# Patient Record
Sex: Male | Born: 1995 | Race: White | Hispanic: No | Marital: Single | Smoking: Never smoker
Health system: Southern US, Community
[De-identification: ages and names within clinical notes are randomized; demographics above are authoritative.]

## PROBLEM LIST (undated history)

## (undated) DIAGNOSIS — F840 Autistic disorder: Secondary | ICD-10-CM

## (undated) DIAGNOSIS — Q860 Fetal alcohol syndrome (dysmorphic): Secondary | ICD-10-CM

## (undated) HISTORY — PX: HERNIA REPAIR: SHX51

---
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
## 2014-12-03 ENCOUNTER — Other Ambulatory Visit (HOSPITAL_COMMUNITY): Payer: Self-pay | Admitting: Family Medicine

## 2014-12-03 ENCOUNTER — Ambulatory Visit (HOSPITAL_COMMUNITY)
Admission: RE | Admit: 2014-12-03 | Discharge: 2014-12-03 | Disposition: A | Discharge status: Home / Self Care | Payer: Medicaid Other | Source: Ambulatory Visit | Attending: Family Medicine | Admitting: Family Medicine

## 2014-12-03 DIAGNOSIS — M545 Low back pain: Secondary | ICD-10-CM

## 2014-12-03 DIAGNOSIS — R200 Anesthesia of skin: Secondary | ICD-10-CM | POA: Diagnosis not present

## 2014-12-03 DIAGNOSIS — M4306 Spondylolysis, lumbar region: Secondary | ICD-10-CM | POA: Insufficient documentation

## 2017-01-27 ENCOUNTER — Emergency Department (HOSPITAL_COMMUNITY)
Admission: EM | Admit: 2017-01-27 | Discharge: 2017-01-27 | Disposition: A | Discharge status: Home / Self Care | Payer: Medicare Other | Attending: Emergency Medicine | Admitting: Emergency Medicine

## 2017-01-27 ENCOUNTER — Encounter (HOSPITAL_COMMUNITY): Payer: Self-pay | Admitting: *Deleted

## 2017-01-27 DIAGNOSIS — Z79899 Other long term (current) drug therapy: Secondary | ICD-10-CM | POA: Diagnosis not present

## 2017-01-27 DIAGNOSIS — E119 Type 2 diabetes mellitus without complications: Secondary | ICD-10-CM | POA: Diagnosis not present

## 2017-01-27 DIAGNOSIS — L03032 Cellulitis of left toe: Secondary | ICD-10-CM | POA: Diagnosis not present

## 2017-01-27 DIAGNOSIS — M79675 Pain in left toe(s): Secondary | ICD-10-CM | POA: Diagnosis present

## 2017-01-27 MED ORDER — LIDOCAINE HCL (PF) 2 % IJ SOLN
2.0000 mL | Freq: Once | INTRAMUSCULAR | Status: AC
  Administered 2017-01-27: 2 mL
  Filled 2017-01-27: qty 10

## 2017-01-27 MED ORDER — CEPHALEXIN 500 MG PO CAPS: 1000.0000 mg | ORAL_CAPSULE | Freq: Two times a day (BID) | ORAL | 0 refills | Status: AC

## 2017-01-27 MED ORDER — CEPHALEXIN 500 MG PO CAPS
1000.0000 mg | ORAL_CAPSULE | Freq: Once | ORAL | Status: AC
  Administered 2017-01-27: 1000 mg via ORAL
  Filled 2017-01-27: qty 2

## 2017-01-27 MED ORDER — POVIDONE-IODINE 10 % EX SOLN
CUTANEOUS | Status: AC
  Filled 2017-01-27: qty 118

## 2017-01-27 NOTE — ED Provider Notes (Addendum)
[facility]-EMER[facility]ENCY DEPT Provider Note   CSN: [id number] Arrival date & time: 01/27/17  1507     History   Chief Complaint Chief Complaint  Patient presents with  . Toe Pain    HPI Gregory Kelley is a 21 y.o. male with a history of autism, presenting with a 2 week history of left toe pain and swelling.  He reports a history of prior ingrown nail with cuticle infection several years ago that had to be "lanced".  He has used warm epsom salt and peroxide soaks with minimal improvement.  He denies fevers, chills, injury to the toe.   The history is provided by the patient and a parent.    Past Medical History:  Diagnosis Date  . Autism   . Diabetes mellitus   . Fetal alcohol syndrome     There are no active problems to display for this patient.   Past Surgical History:  Procedure Laterality Date  . HERNIA REPAIR         Home Medications    Prior to Admission medications   Medication Sig Start Date End Date Taking? Authorizing Provider  ARIPiprazole (ABILIFY) 5 MG tablet Take 5 mg by mouth 3 (three) times daily.      Historical Provider, MD  atomoxetine (STRATTERA) 100 MG capsule Take 50 mg by mouth 2 (two) times daily.      Historical Provider, MD  cephALEXin (KEFLEX) 500 MG capsule Take 2 capsules (1,000 mg total) by mouth 2 (two) times daily. 01/27/17 02/06/17  Burgess Amor, PA-C  methylphenidate (CONCERTA) 54 MG CR tablet Take 54 mg by mouth 2 (two) times daily.      Historical Provider, MD  methylphenidate (RITALIN LA) 20 MG 24 hr capsule Take 20 mg by mouth every morning.      Historical Provider, MD  UNABLE TO FIND Med Name:    Historical Provider, MD    Family History Family History  Problem Relation Age of Onset  . Adopted: Yes    Social History Social History  Substance Use Topics  . Smoking status: Never Smoker  . Smokeless tobacco: Never Used  . Alcohol use Yes     Comment: occasionally      Allergies   Patient has no known allergies.   Review  of Systems Review of Systems  Constitutional: Negative for fever.  Musculoskeletal: Positive for arthralgias. Negative for joint swelling and myalgias.  Skin: Positive for color change.  Neurological: Negative for weakness and numbness.     Physical Exam Updated Vital Signs BP 124/89 (BP Location: Right Arm)   Pulse 90   Temp 98.4 F (36.9 C) (Oral)   Resp 18   Ht  (1.702 m)   Wt 113.4 kg   SpO2 99%   BMI 39.16 kg/m   Physical Exam  Constitutional: He appears well-developed and well-nourished.  HENT:  Head: Atraumatic.  Neck: Normal range of motion.  Cardiovascular:  Pulses:      Dorsalis pedis pulses are 2+ on the left side.  Musculoskeletal: He exhibits edema and tenderness.  ttp with edema left medial great toenail edge.  Dried blood along nail plate edge. Moderate erythema and edema of the medial cuticle.  No red streaking.    Neurological: He is alert. He has normal strength. He displays normal reflexes. No sensory deficit.  Skin: Skin is warm and dry.  Psychiatric: He has a normal mood and affect.     ED Treatments / Results  Labs (all labs  ordered are listed, but only abnormal results are displayed) Labs Reviewed - No data to display  EKG  EKG Interpretation None       Radiology No results found.  Procedures Drain paronychia Date/Time: 01/27/2017 5:58 PM Performed by: Burgess Amor Authorized by: Burgess Amor  Consent: Verbal consent obtained. Consent given by: patient and parent Patient understanding: patient states understanding of the procedure being performed Patient consent: the patient's understanding of the procedure matches consent given Patient identity confirmed: verbally with patient Time out: Immediately prior to procedure a "time out" was called to verify the correct patient, procedure, equipment, support staff and site/side marked as required. Preparation: Patient was prepped and draped in the usual sterile fashion. Local  anesthesia used: yes Anesthesia: digital block  Anesthesia: Local anesthesia used: yes Local Anesthetic: lidocaine 1% without epinephrine Anesthetic total: 2 mL  Sedation: Patient sedated: no Patient tolerance: Patient tolerated the procedure well with no immediate complications Comments: #11 blade used to raise the cuticle edge from the nail plate.  No incision was made.  Small amount of purulence obtained.  Moderate bleeding.  No ingrown nail appreciated.    (including critical care time)  Medications Ordered in ED Medications  povidone-iodine (BETADINE) 10 % external solution (not administered)  cephALEXin (KEFLEX) capsule 1,000 mg (not administered)  lidocaine (XYLOCAINE) 2 % injection 2 mL (2 mLs Other Given by Other 01/27/17 1706)     Initial Impression / Assessment and Plan / ED Course  I have reviewed the triage vital signs and the nursing notes.  Pertinent labs & imaging results that were available during my care of the patient were reviewed by me and considered in my medical decision making (see chart for details).     Keflex, continue warm soaks.  Prn f/u if not improving over the next week.  Referral to podiatry prn.  Final Clinical Impressions(s) / ED Diagnoses   Final diagnoses:  Paronychia of great toe of left foot    New Prescriptions New Prescriptions   CEPHALEXIN (KEFLEX) 500 MG CAPSULE    Take 2 capsules (1,000 mg total) by mouth 2 (two) times daily.     Burgess Amor, PA-C 01/27/17 1801    Benjiman Core, MD 01/28/17 Burna Mortimer    Burgess Amor, PA-C 02/14/17 2058    Burgess Amor, PA-C 02/14/17 2102    Benjiman Core, MD 02/16/17 [phone]

## 2017-01-27 NOTE — ED Notes (Signed)
Pt made aware to return if symptoms worsen or if any life threatening symptoms occur.   

## 2017-01-27 NOTE — Discharge Instructions (Signed)
Take your next dose of the antibiotic tomorrow morning.  Continue doing a warm epsom salt soak for 15 minutes twice daily.  Keep your wound covered and clean.  Wear the post op shoe given if this improves your pain.

## 2017-01-27 NOTE — ED Triage Notes (Signed)
Pt c/o left great toe pain x couple weeks. Pt has soaked the toe in epsom salt and peroxide. Pt's left great toe has swelling next to nailbed. No drainage at this time. Denies fever.

## 2017-12-30 ENCOUNTER — Other Ambulatory Visit (HOSPITAL_COMMUNITY): Payer: Self-pay | Admitting: Neurology

## 2017-12-30 ENCOUNTER — Other Ambulatory Visit (HOSPITAL_BASED_OUTPATIENT_CLINIC_OR_DEPARTMENT_OTHER): Payer: Self-pay

## 2017-12-30 DIAGNOSIS — G4733 Obstructive sleep apnea (adult) (pediatric): Secondary | ICD-10-CM

## 2017-12-30 DIAGNOSIS — R519 Headache, unspecified: Secondary | ICD-10-CM

## 2017-12-30 DIAGNOSIS — R51 Headache: Principal | ICD-10-CM

## 2018-01-06 ENCOUNTER — Ambulatory Visit (HOSPITAL_COMMUNITY): Admission: RE | Admit: 2018-01-06 | Payer: Medicare Other | Source: Ambulatory Visit

## 2018-01-17 ENCOUNTER — Encounter (INDEPENDENT_AMBULATORY_CARE_PROVIDER_SITE_OTHER): Payer: Self-pay

## 2018-01-17 ENCOUNTER — Ambulatory Visit: Payer: BLUE CROSS/BLUE SHIELD | Attending: Neurology | Admitting: Neurology

## 2018-01-17 DIAGNOSIS — Z79899 Other long term (current) drug therapy: Secondary | ICD-10-CM | POA: Insufficient documentation

## 2018-01-17 DIAGNOSIS — R0683 Snoring: Secondary | ICD-10-CM | POA: Insufficient documentation

## 2018-01-17 DIAGNOSIS — G4733 Obstructive sleep apnea (adult) (pediatric): Secondary | ICD-10-CM | POA: Diagnosis present

## 2018-01-22 NOTE — Procedures (Signed)
   [facility] Camala Talwar A. Gerilyn Pilgrimoonquah, MD     [email or web address]             NOCTURNAL POLYSOMNOGRAPHY   LOCATION: [facility]  Patient Name: Lorrin MaisCrumpton, Safal Study Date: 01/17/2018 Gender: Male D.O.B: [birth year] Age (years): 21 Referring Provider: Jorge MandrilAyeshia Powell NP Height (inches): 70 Interpreting Physician: Beryle BeamsKofi Kelsay Haggard MD, ABSM Weight (lbs): 258 RPSGT: Alfonso EllisHedrick, Debra BMI: 37 MRN: [id number] Neck Size: 19.00 CLINICAL INFORMATION Sleep Study Type: NPSG     Indication for sleep study: N/A     Epworth Sleepiness Score: 11     SLEEP STUDY TECHNIQUE As per the AASM Manual for the Scoring of Sleep and Associated Events v2.3 (April 2016) with a hypopnea requiring 4% desaturations.  The channels recorded and monitored were frontal, central and occipital EEG, electrooculogram (EOG), submentalis EMG (chin), nasal and oral airflow, thoracic and abdominal wall motion, anterior tibialis EMG, snore microphone, electrocardiogram, and pulse oximetry.  MEDICATIONS Medications self-administered by patient taken the night of the study : N/A  Current Outpatient Medications:  .  ARIPiprazole (ABILIFY) 5 MG tablet, Take 5 mg by mouth 3 (three) times daily.  , Disp: , Rfl:  .  atomoxetine (STRATTERA) 100 MG capsule, Take 50 mg by mouth 2 (two) times daily.  , Disp: , Rfl:  .  methylphenidate (CONCERTA) 54 MG CR tablet, Take 54 mg by mouth 2 (two) times daily.  , Disp: , Rfl:  .  methylphenidate (RITALIN LA) 20 MG 24 hr capsule, Take 20 mg by mouth every morning.  , Disp: , Rfl:        SLEEP ARCHITECTURE The study was initiated at 10:25:12 PM and ended at 5:14:19 AM.   Sleep onset time was 8.8 minutes and the sleep efficiency was 91.3%%. The total sleep time was 373.4 minutes.  Stage REM latency was 134.5 minutes.  The patient spent 2.5%% of the night in stage N1 sleep, 69.7%% in stage N2 sleep, 14.1%% in stage N3 and 13.66% in REM.  Alpha intrusion was  absent.  Supine sleep was 90.29%.  RESPIRATORY PARAMETERS The overall apnea/hypopnea index (AHI) was 1.1 per hour. There were 1 total apneas, including 1 obstructive, 0 central and 0 mixed apneas. There were 6 hypopneas and 1 RERAs.  The AHI during Stage REM sleep was 8.2 per hour.  AHI while supine was 1.2 per hour.  The mean oxygen saturation was 95.9%. The minimum SpO2 during sleep was 91.0%.  moderate snoring was noted during this study.  CARDIAC DATA The 2 lead EKG demonstrated sinus rhythm. The mean heart rate was 80.2 beats per minute. Other EKG findings include: None. LEG MOVEMENT DATA The total PLMS were 0 with a resulting PLMS index of 0.0. Associated arousal with leg movement index was 3.7.  IMPRESSIONS 1. This recording is unremarkable.  Argie RammingKofi A Braydon Kullman, MD Diplomate, American Board of Sleep Medicine. ELECTRONICALLY SIGNED ON:  01/22/2018, 8:46 PM [facility] PH: [phone]   FX: [phone] ACCREDITED BY THE AMERICAN ACADEMY OF SLEEP MEDICINE

## 2018-01-23 ENCOUNTER — Ambulatory Visit (HOSPITAL_COMMUNITY)
Admission: RE | Admit: 2018-01-23 | Discharge: 2018-01-23 | Disposition: A | Discharge status: Home / Self Care | Payer: Medicare Other | Source: Ambulatory Visit | Attending: Neurology | Admitting: Neurology

## 2018-01-23 DIAGNOSIS — R51 Headache: Secondary | ICD-10-CM | POA: Insufficient documentation

## 2018-01-23 DIAGNOSIS — R519 Headache, unspecified: Secondary | ICD-10-CM

## 2018-09-01 ENCOUNTER — Ambulatory Visit (HOSPITAL_COMMUNITY)
Admission: RE | Admit: 2018-09-01 | Discharge: 2018-09-01 | Disposition: A | Discharge status: Home / Self Care | Payer: BLUE CROSS/BLUE SHIELD | Source: Ambulatory Visit | Attending: Family Medicine | Admitting: Family Medicine

## 2018-09-01 ENCOUNTER — Other Ambulatory Visit (HOSPITAL_COMMUNITY): Payer: Self-pay | Admitting: Family Medicine

## 2018-09-01 DIAGNOSIS — M542 Cervicalgia: Secondary | ICD-10-CM | POA: Diagnosis present

## 2018-09-01 DIAGNOSIS — G8929 Other chronic pain: Secondary | ICD-10-CM | POA: Diagnosis present

## 2019-08-04 IMAGING — DX DG CERVICAL SPINE COMPLETE 4+V
7 series · 7 of 7 positions shown · non-contrast
Comparison: None.

CLINICAL DATA: Chronic neck pain.

EXAM:
CERVICAL SPINE - COMPLETE 4+ VIEW

[c-spine lat]
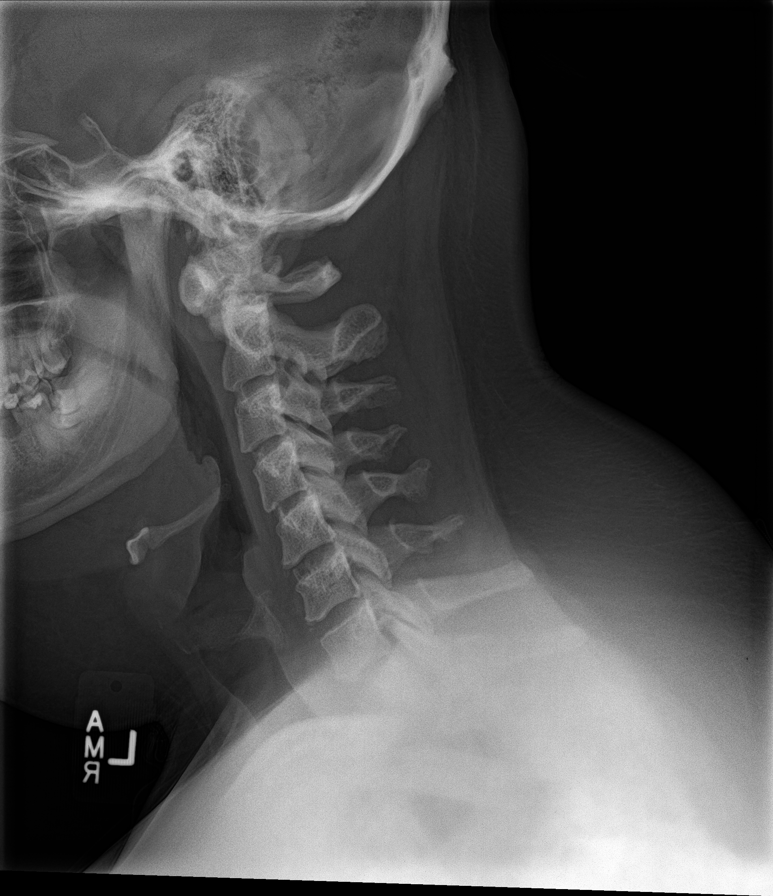

[c-spine obl (1 of 2)]
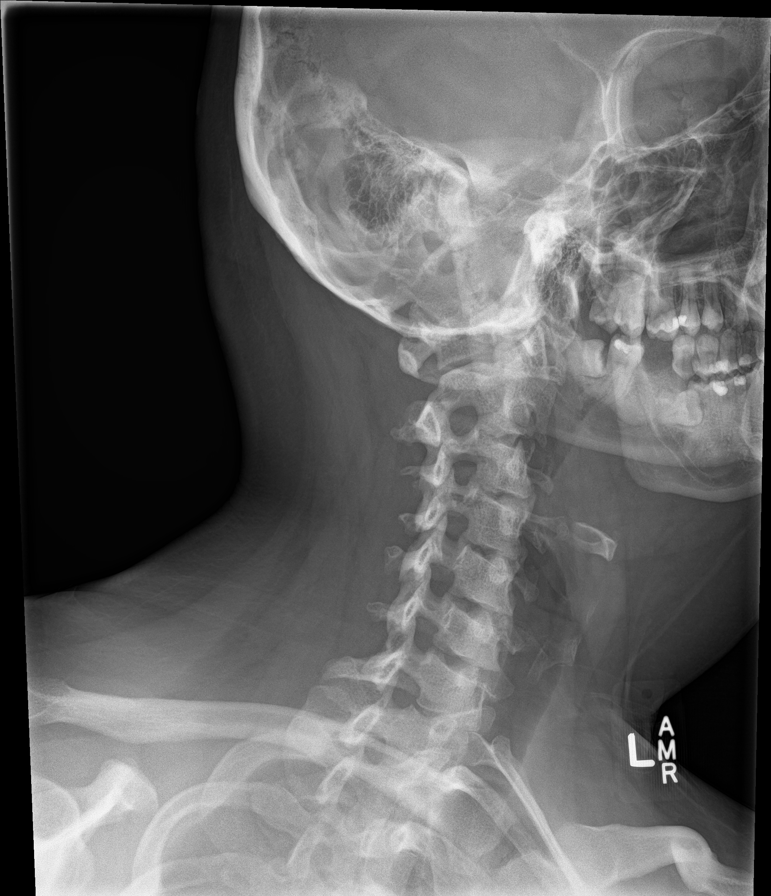

[c-spine obl (2 of 2)]
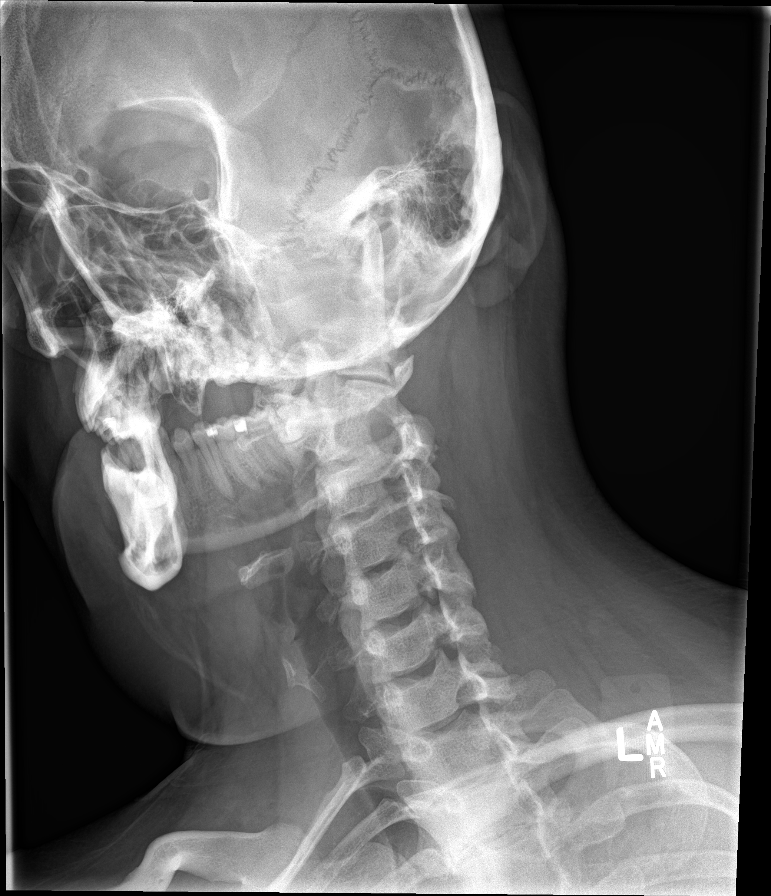

[c-spine ap]
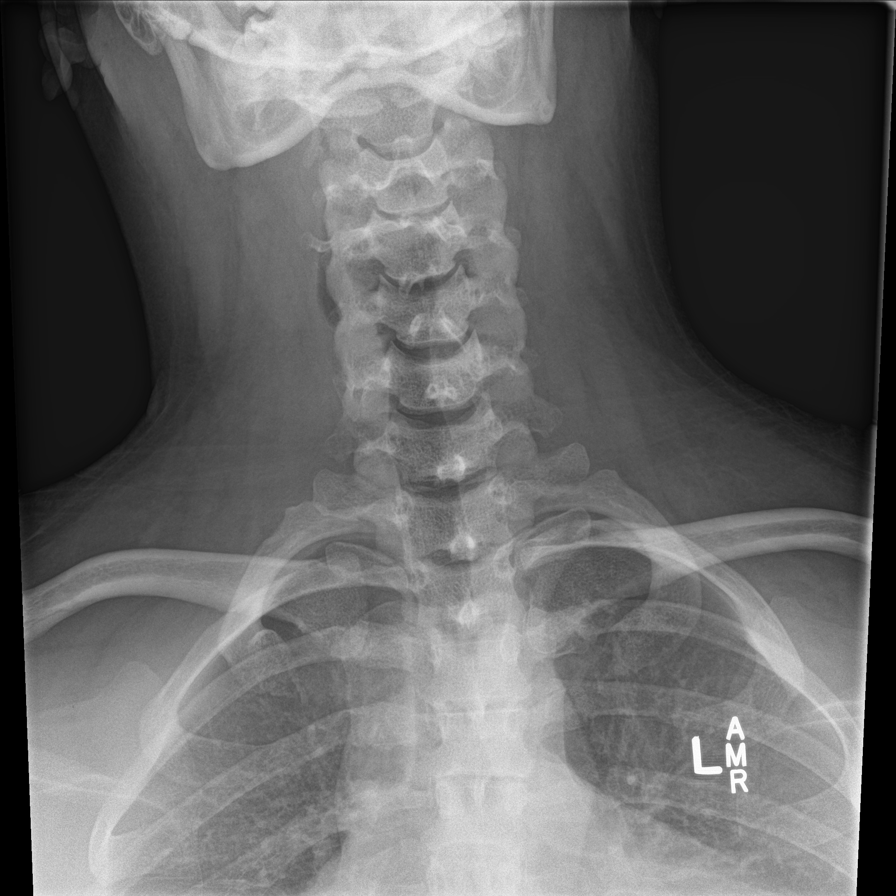

[c-spine open mouth (1 of 3)]
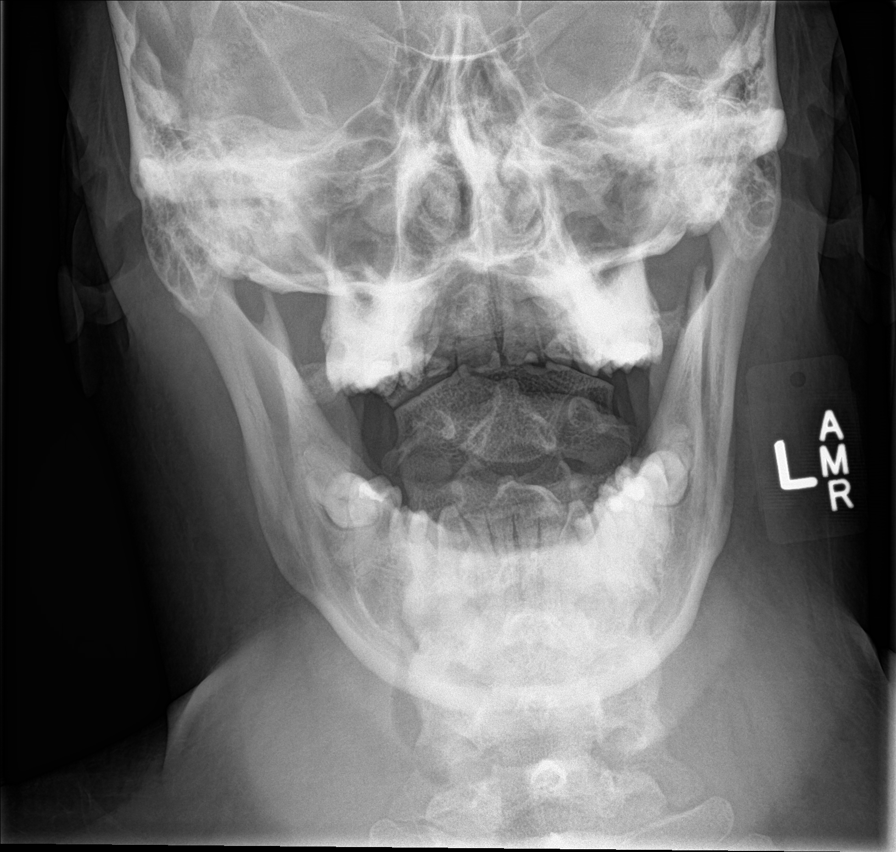

[c-spine open mouth (2 of 3)]
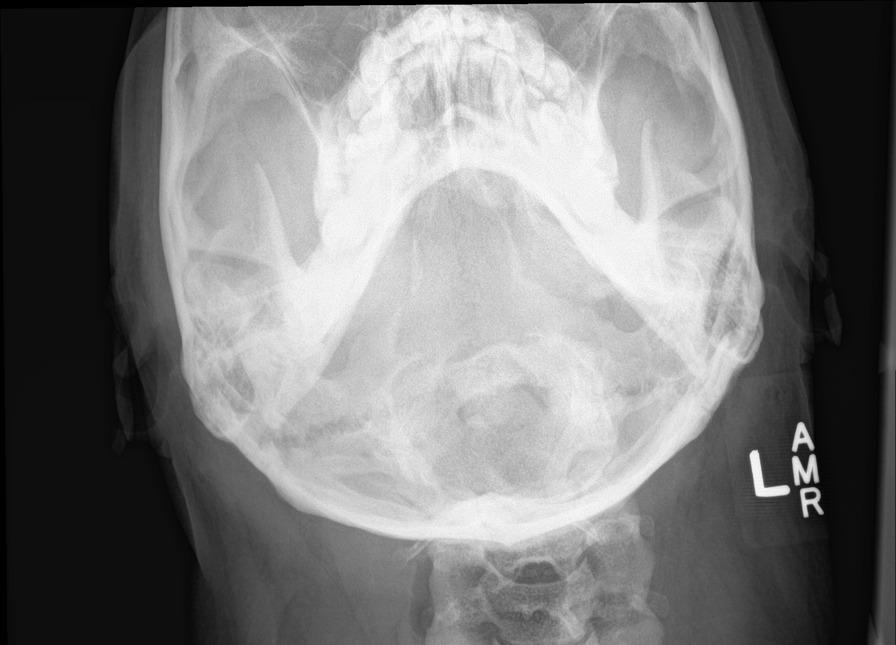

[c-spine open mouth (3 of 3)]
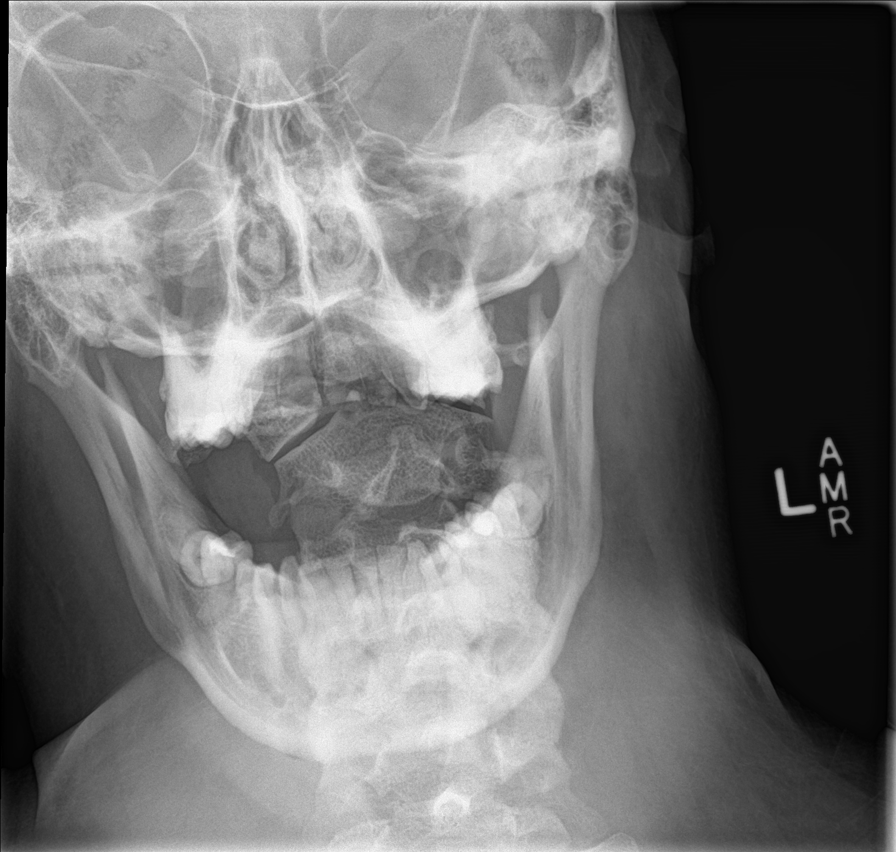

[7 of 7 positions shown; findings below may reference images not displayed]

FINDINGS: There is no evidence of acute cervical spine fracture or
prevertebral soft tissue swelling. Alignment is normal.
Intervertebral disc spaces are maintained. No evidence of facet DJD
or foraminal stenosis.

The odontoid process is diminutive in appearance, which may
secondary to os odontoideum or congenital hypoplasia.
IMPRESSION: No acute findings.

Abnormal appearance of odontoid, suspicious for os odontoideum or
congenital hypoplasia. Recommend cervical spine CT without contrast
for further evaluation.

## 2020-11-16 ENCOUNTER — Encounter (HOSPITAL_COMMUNITY): Payer: Self-pay

## 2020-11-16 ENCOUNTER — Emergency Department (HOSPITAL_COMMUNITY): Payer: Medicare Other

## 2020-11-16 ENCOUNTER — Other Ambulatory Visit: Payer: Self-pay

## 2020-11-16 ENCOUNTER — Emergency Department (HOSPITAL_COMMUNITY)
Admission: EM | Admit: 2020-11-16 | Discharge: 2020-11-16 | Disposition: A | Discharge status: Home / Self Care | Payer: Medicare Other | Attending: Emergency Medicine | Admitting: Emergency Medicine

## 2020-11-16 DIAGNOSIS — F840 Autistic disorder: Secondary | ICD-10-CM | POA: Insufficient documentation

## 2020-11-16 DIAGNOSIS — N23 Unspecified renal colic: Secondary | ICD-10-CM | POA: Diagnosis not present

## 2020-11-16 DIAGNOSIS — E119 Type 2 diabetes mellitus without complications: Secondary | ICD-10-CM | POA: Insufficient documentation

## 2020-11-16 DIAGNOSIS — R1031 Right lower quadrant pain: Secondary | ICD-10-CM | POA: Diagnosis present

## 2020-11-16 LAB — COMPREHENSIVE METABOLIC PANEL
ALT: 111 U/L — ABNORMAL HIGH (ref 0–44)
AST: 42 U/L — ABNORMAL HIGH (ref 15–41)
Albumin: 4.1 g/dL (ref 3.5–5.0)
Alkaline Phosphatase: 88 U/L (ref 38–126)
Anion gap: 8 (ref 5–15)
BUN: 11 mg/dL (ref 6–20)
CO2: 26 mmol/L (ref 22–32)
Calcium: 9.6 mg/dL (ref 8.9–10.3)
Chloride: 105 mmol/L (ref 98–111)
Creatinine, Ser: 0.71 mg/dL (ref 0.61–1.24)
GFR, Estimated: >60 mL/min (ref >60)
Glucose, Bld: 133 mg/dL — ABNORMAL HIGH (ref 70–99)
Potassium: 3.5 mmol/L (ref 3.5–5.1)
Sodium: 139 mmol/L (ref 135–145)
Total Bilirubin: 0.9 mg/dL (ref 0.3–1.2)
Total Protein: 7.5 g/dL (ref 6.5–8.1)

## 2020-11-16 LAB — CBC WITH DIFFERENTIAL/PLATELET
Abs Immature Granulocytes: 0.04 10*3/uL (ref 0.00–0.07)
Basophils Absolute: 0.1 10*3/uL (ref 0.0–0.1)
Basophils Relative: 1 %
Eosinophils Absolute: 0.2 10*3/uL (ref 0.0–0.5)
Eosinophils Relative: 3 %
HCT: 44 % (ref 39.0–52.0)
Hemoglobin: 14.1 g/dL (ref 13.0–17.0)
Immature Granulocytes: 0 %
Lymphocytes Relative: 35 %
Lymphs Abs: 3.2 10*3/uL (ref 0.7–4.0)
MCH: 25.9 pg — ABNORMAL LOW (ref 26.0–34.0)
MCHC: 32 g/dL (ref 30.0–36.0)
MCV: 80.7 fL (ref 80.0–100.0)
Monocytes Absolute: 0.8 10*3/uL (ref 0.1–1.0)
Monocytes Relative: 8 %
Neutro Abs: 5 10*3/uL (ref 1.7–7.7)
Neutrophils Relative %: 53 %
Platelets: 284 10*3/uL (ref 150–400)
RBC: 5.45 MIL/uL (ref 4.22–5.81)
RDW: 13.3 % (ref 11.5–15.5)
WBC: 9.2 10*3/uL (ref 4.0–10.5)
nRBC: 0 % (ref 0.0–0.2)

## 2020-11-16 LAB — LIPASE, BLOOD: Lipase: 73 U/L — ABNORMAL HIGH (ref 11–51)

## 2020-11-16 MED ORDER — MORPHINE SULFATE (PF) 4 MG/ML IV SOLN
4.0000 mg | Freq: Once | INTRAVENOUS | Status: AC
  Administered 2020-11-16: 4 mg via INTRAVENOUS
  Filled 2020-11-16: qty 1

## 2020-11-16 MED ORDER — ONDANSETRON HCL 4 MG/2ML IJ SOLN
4.0000 mg | Freq: Once | INTRAMUSCULAR | Status: AC
  Administered 2020-11-16: 4 mg via INTRAVENOUS
  Filled 2020-11-16: qty 2

## 2020-11-16 MED ORDER — ONDANSETRON HCL 4 MG PO TABS: 4.0000 mg | ORAL_TABLET | Freq: Four times a day (QID) | ORAL | 0 refills | Status: DC | PRN

## 2020-11-16 MED ORDER — SODIUM CHLORIDE 0.9 % IV BOLUS
1000.0000 mL | Freq: Once | INTRAVENOUS | Status: AC
  Administered 2020-11-16: 1000 mL via INTRAVENOUS

## 2020-11-16 MED ORDER — IOHEXOL 300 MG/ML  SOLN
100.0000 mL | Freq: Once | INTRAMUSCULAR | Status: AC | PRN
  Administered 2020-11-16: 100 mL via INTRAVENOUS

## 2020-11-16 MED ORDER — HYDROCODONE-ACETAMINOPHEN 5-325 MG PO TABS: 1.0000 | ORAL_TABLET | ORAL | 0 refills | Status: DC | PRN

## 2020-11-16 MED ORDER — TAMSULOSIN HCL 0.4 MG PO CAPS: 0.4000 mg | ORAL_CAPSULE | Freq: Every day | ORAL | 0 refills | Status: DC

## 2020-11-16 MED ORDER — KETOROLAC TROMETHAMINE 30 MG/ML IJ SOLN
30.0000 mg | Freq: Once | INTRAMUSCULAR | Status: AC
  Administered 2020-11-16: 30 mg via INTRAVENOUS
  Filled 2020-11-16: qty 1

## 2020-11-16 NOTE — ED Provider Notes (Signed)
[facility] EMERGENCY DEPARTMENT Provider Note   CSN: [id number] Arrival date & time: 11/16/20  7505     History Chief Complaint  Patient presents with  . Abdominal Pain    Gregory Kelley is a 25 y.o. male.  Patient brought to the emergency department for evaluation of abdominal pain.  Pain started earlier today.  Patient reports that he has been "going to the bathroom a lot".  He has not had nausea or vomiting.  Patient does have a history of autism has difficulty describing his symptoms.  Mother reports that he does not normally complain of pain.        Past Medical History:  Diagnosis Date  . Autism   . Diabetes mellitus   . Fetal alcohol syndrome     Patient Active Problem List   Diagnosis Date Noted  . 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 Developmental expressive language disorder 07/26/2011  . Diabetes mellitus, type II (HCC) 07/26/2011  . 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 Oppositional defiant disorder 07/26/2011  . Overweight 07/26/2011  . 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 HERNIA REPAIR         Family History  Adopted: Yes    Social History   Tobacco Use  . Smoking status: Never Smoker  . Smokeless tobacco: Never Used  Substance Use Topics  . Alcohol use: Yes    Comment: occasionally   . Drug use: No    Home Medications Prior to Admission medications   Medication Sig Start Date End Date Taking? Authorizing Provider  HYDROcodone-acetaminophen (NORCO/VICODIN) 5-325 MG tablet Take 1 tablet by mouth every 4 (four) hours as needed for moderate pain. 11/16/20  Yes Dyneshia Baccam, Canary Brim, MD  ondansetron (ZOFRAN) 4 MG tablet Take 1 tablet (4 mg total) by  mouth every 6 (six) hours as needed for nausea or vomiting. 11/16/20  Yes Krystall Kruckenberg, Canary Brim, MD  tamsulosin (FLOMAX) 0.4 MG CAPS capsule Take 1 capsule (0.4 mg total) by mouth daily. 11/16/20  Yes Lyan Holck, Canary Brim, MD  amphetamine-dextroamphetamine (ADDERALL) 30 MG tablet Take 1 tablet by mouth 2 (two) times daily. 10/14/20   [provider]  ARIPiprazole (ABILIFY) 5 MG tablet Take 5 mg by mouth 3 (three) times daily.      [provider]  atomoxetine (STRATTERA) 100 MG capsule Take 50 mg by mouth 2 (two) times daily.      [provider]  methylphenidate (CONCERTA) 54 MG CR tablet Take 54 mg by mouth 2 (two) times daily.      [provider]  methylphenidate (RITALIN LA) 20 MG 24 hr capsule Take 20 mg by mouth every morning.      [provider]       [provider]    Allergies    Patient has no known allergies.  Review of Systems   Review of Systems  Gastrointestinal: Positive for abdominal pain.  All other systems reviewed and are negative.   Physical Exam Updated Vital Signs BP 128/89   Pulse 80   Temp 98 F (36.7 C)   Resp 16   SpO2 98%   Physical Exam Vitals and nursing note reviewed.  Constitutional:      General: He is not in acute distress.  Appearance: Normal appearance. He is well-developed and well-nourished.  HENT:     Head: Normocephalic and atraumatic.     Right Ear: Hearing normal.     Left Ear: Hearing normal.     Nose: Nose normal.     Mouth/Throat:     Mouth: Oropharynx is clear and moist and mucous membranes are normal.  Eyes:     Extraocular Movements: EOM normal.     Conjunctiva/sclera: Conjunctivae normal.     Pupils: Pupils are equal, round, and reactive to light.  Cardiovascular:     Rate and Rhythm: Regular rhythm.     Heart sounds: S1 normal and S2 normal. No murmur heard. No friction rub. No gallop.   Pulmonary:     Effort: Pulmonary effort is normal. No respiratory distress.      Breath sounds: Normal breath sounds.  Chest:     Chest wall: No tenderness.  Abdominal:     General: Bowel sounds are normal.     Palpations: Abdomen is soft. There is no hepatosplenomegaly.     Tenderness: There is abdominal tenderness in the right lower quadrant. There is no guarding or rebound. Negative signs include Murphy's sign and McBurney's sign.     Hernia: No hernia is present.  Musculoskeletal:        General: Normal range of motion.     Cervical back: Normal range of motion and neck supple.  Skin:    General: Skin is warm, dry and intact.     Findings: No rash.     Nails: There is no cyanosis.  Neurological:     Mental Status: He is alert and oriented to person, place, and time.     GCS: GCS eye subscore is 4. GCS verbal subscore is 5. GCS motor subscore is 6.     Cranial Nerves: No cranial nerve deficit.     Sensory: No sensory deficit.     Coordination: Coordination normal.     Deep Tendon Reflexes: Strength normal.  Psychiatric:        Mood and Affect: Mood and affect normal.        Speech: Speech normal.        Behavior: Behavior normal.        Thought Content: Thought content normal.     ED Results / Procedures / Treatments   Labs (all labs ordered are listed, but only abnormal results are displayed) Labs Reviewed  CBC WITH DIFFERENTIAL/PLATELET - Abnormal; Notable for the following components:      Result Value   MCH 25.9 (*)    All other components within normal limits  COMPREHENSIVE METABOLIC PANEL - Abnormal; Notable for the following components:   Glucose, Bld 133 (*)    AST 42 (*)    ALT 111 (*)    All other components within normal limits  LIPASE, BLOOD - Abnormal; Notable for the following components:   Lipase 73 (*)    All other components within normal limits  URINALYSIS, ROUTINE W REFLEX MICROSCOPIC    EKG None  Radiology CT ABDOMEN PELVIS W CONTRAST  Result Date: 11/16/2020 CLINICAL DATA:  Right lower quadrant abdominal pain.  Suspect appendicitis. EXAM: CT ABDOMEN AND PELVIS WITH CONTRAST TECHNIQUE: Multidetector CT imaging of the abdomen and pelvis was performed using the standard protocol following bolus administration of intravenous contrast. CONTRAST:  OMNIPAQUE IOHEXOL 300 MG/ML  SOLN COMPARISON:  None. FINDINGS: Lower chest: The lung bases are clear without focal nodule, mass, or airspace disease. Heart  size is normal. No significant pleural or pericardial effusion is present. Hepatobiliary: No focal liver abnormality is seen. No gallstones, gallbladder wall thickening, or biliary dilatation. Pancreas: Unremarkable. No pancreatic ductal dilatation or surrounding inflammatory changes. Spleen: Normal in size without focal abnormality. Adrenals/Urinary Tract: Adrenal glands are normal bilaterally. Moderate right-sided hydronephrosis is present. Two distal obstructing ureteral stones are present just above the UVJ. Each stone measures 3 mm. No additional stones are evident in either kidney. No mass lesion is present. Left kidney and ureter are within normal limits. The urinary bladder is unremarkable. Stomach/Bowel: The stomach and duodenum are within normal limits. Small bowel is unremarkable. Terminal ileum is within normal limits. Appendix is visualized and normal. The ascending and transverse colon are within normal limits. The descending and sigmoid colon are normal. Vascular/Lymphatic: No significant vascular findings are present. No enlarged abdominal or pelvic lymph nodes. Reproductive: Prostate is unremarkable. Other: No abdominal wall hernia or abnormality. No abdominopelvic ascites. Musculoskeletal: Bilateral L5 pars defects are present. 4 mm anterolisthesis is present at L5-S1. No other significant listhesis is present. Schmorl's nodes are present. Vertebral body heights are otherwise within normal limits. Bony pelvis is normal. Hips are located and within normal limits. IMPRESSION: 1. Two distal obstructing ureteral  stones just above the UVJ measuring 3 mm each. 2. Moderate right-sided hydronephrosis and ureteral dilation. 3. No additional stones are present in either kidney. 4. 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 These results were called by telephone at the time of interpretation on 11/16/2020 at 5:22 am to provider [facility] , who verbally acknowledged these results. Electronically Signed   By: Marin Roberts M.D.   On: 11/16/2020 05:23    Procedures Procedures   Medications Ordered in ED Medications  ketorolac (TORADOL) 30 MG/ML injection 30 mg (has no administration in time range)  ondansetron (ZOFRAN) injection 4 mg (has no administration in time range)  sodium chloride 0.9 % bolus 1,000 mL (0 mLs Intravenous Stopped 11/16/20 0429)  morphine 4 MG/ML injection 4 mg (4 mg Intravenous Given 11/16/20 0319)  ondansetron (ZOFRAN) injection 4 mg (4 mg Intravenous Given 11/16/20 0320)  iohexol (OMNIPAQUE) 300 MG/ML solution 100 mL (100 mLs Intravenous Contrast Given 11/16/20 0442)    ED Course  I have reviewed the triage vital signs and the nursing notes.  Pertinent labs & imaging results that were available during my care of the patient were reviewed by me and considered in my medical decision making (see chart for details).    MDM Rules/Calculators/A&P                          Patient presents to the emergency department for evaluation of right-sided abdominal pain.  He does have mild right-sided tenderness but no guarding, rebound or signs of peritonitis.  Blood work was unremarkable.  CT abdomen and pelvis performed to rule out appendicitis.  No evidence of appendicitis noted but he does have right-sided hydronephrosis with 2 3 mm distal ureteral stones which explains his symptoms.  Patient treated with analgesia, will be discharged with analgesia, Flomax, follow-up with urology.  Final Clinical Impression(s) / ED Diagnoses Final diagnoses:  Renal colic on right  side    Rx / DC Orders ED Discharge Orders         Ordered    HYDROcodone-acetaminophen (NORCO/VICODIN) 5-325 MG tablet  Every 4 hours PRN        11/16/20 0548    tamsulosin (  FLOMAX) 0.4 MG CAPS capsule  Daily        11/16/20 0548    ondansetron (ZOFRAN) 4 MG tablet  Every 6 hours PRN        11/16/20 0548           Gilda Crease, MD 11/16/20 [phone]

## 2020-11-16 NOTE — ED Triage Notes (Signed)
Pt c/o RLQ pain that started today. Per mother pt normally doesn't complain of pain. Pt reports normal BM yesterday.

## 2021-01-29 ENCOUNTER — Encounter (HOSPITAL_COMMUNITY): Payer: Self-pay | Admitting: Emergency Medicine

## 2021-01-29 ENCOUNTER — Emergency Department (HOSPITAL_COMMUNITY): Payer: Medicare Other

## 2021-01-29 ENCOUNTER — Emergency Department (HOSPITAL_COMMUNITY)
Admission: EM | Admit: 2021-01-29 | Discharge: 2021-01-29 | Disposition: A | Discharge status: Home / Self Care | Payer: Medicare Other | Attending: Emergency Medicine | Admitting: Emergency Medicine

## 2021-01-29 ENCOUNTER — Other Ambulatory Visit: Payer: Self-pay

## 2021-01-29 DIAGNOSIS — L03011 Cellulitis of right finger: Secondary | ICD-10-CM | POA: Diagnosis present

## 2021-01-29 DIAGNOSIS — E119 Type 2 diabetes mellitus without complications: Secondary | ICD-10-CM | POA: Diagnosis not present

## 2021-01-29 DIAGNOSIS — F840 Autistic disorder: Secondary | ICD-10-CM | POA: Diagnosis not present

## 2021-01-29 MED ORDER — DOXYCYCLINE HYCLATE 100 MG PO TABS
100.0000 mg | ORAL_TABLET | Freq: Once | ORAL | Status: AC
  Administered 2021-01-29: 100 mg via ORAL
  Filled 2021-01-29: qty 1

## 2021-01-29 MED ORDER — DOXYCYCLINE HYCLATE 100 MG PO CAPS: 100.0000 mg | ORAL_CAPSULE | Freq: Two times a day (BID) | ORAL | 0 refills | Status: DC

## 2021-01-29 MED ORDER — LIDOCAINE HCL (PF) 1 % IJ SOLN
10.0000 mL | Freq: Once | INTRAMUSCULAR | Status: AC
  Administered 2021-01-29: 10 mL
  Filled 2021-01-29: qty 30

## 2021-01-29 NOTE — Discharge Instructions (Signed)
Paronychia that was drained today.  Keep area covered, clean and dry, soak the hand in warm water at least 3 times a day to help promote drainage and healing.  If you notice increased swelling, redness or drainage over the next few days please return for reevaluation.  Take antibiotics twice daily with food to help treat infection.  Ibuprofen and Tylenol as needed for pain.  Follow-up with your primary care provider.

## 2021-01-29 NOTE — ED Provider Notes (Signed)
[facility] EMERGENCY DEPARTMENT Provider Note   CSN: [id number] Arrival date & time: 01/29/21  2103     History Chief Complaint  Patient presents with  . Hand Pain    Gregory Kelley is a 25 y.o. male.  Gregory Kelley is a 25 y.o. male with a history of fetal alcohol syndrome, autism, diabetes, who presents to the ED for infection to the right index finger.  Pain redness and swelling worsening since Monday.  Mom reports she thinks he pulled a hangnail, but he did not show it to her until today because it was increasingly painful and swollen.  They tried soaking it in some alcohol and hydrogen peroxide but this did not help and it has not drained at all at home.  Had this happen once before a lot of toe and had to have it drained.  No fevers but patient has had some chills.  No other systemic symptoms.  Able to move the finger without difficulty.  No other aggravating or alleviating factors.        Past Medical History:  Diagnosis Date  . Autism   . Diabetes mellitus   . Fetal alcohol syndrome     Patient Active Problem List   Diagnosis Date Noted  . 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 Developmental expressive language disorder 07/26/2011  . Diabetes mellitus, type II (HCC) 07/26/2011  . 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 Oppositional defiant disorder 07/26/2011  . Overweight 07/26/2011  . 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 HERNIA REPAIR         Family History  Adopted: Yes    Social History   Tobacco Use  . Smoking status: Never Smoker  . Smokeless tobacco: Never Used  Substance Use Topics  . Alcohol use: Yes    Comment: occasionally   . Drug use: No     Home Medications Prior to Admission medications   Medication Sig Start Date End Date Taking? Authorizing Provider  amphetamine-dextroamphetamine (ADDERALL) 30 MG tablet Take 1 tablet by mouth 2 (two) times daily. 10/14/20  Yes [provider]  doxycycline (VIBRAMYCIN) 100 MG capsule Take 1 capsule (100 mg total) by mouth 2 (two) times daily. 01/29/21  Yes Dartha Lodge, PA-C  ibuprofen (ADVIL) 800 MG tablet Take 800 mg by mouth every 6 (six) hours as needed for moderate pain or headache. 12/12/20  Yes [provider]  Multiple Vitamin (MULTI-VITAMIN) tablet Take 1 tablet by mouth daily.   Yes [provider]  ondansetron (ZOFRAN) 4 MG tablet Take 1 tablet (4 mg total) by mouth every 6 (six) hours as needed for nausea or vomiting. 11/16/20  Yes Pollina, Canary Brim, MD  VYVANSE 70 MG capsule Take 70 mg by mouth every morning. 01/29/21  Yes [provider]  ARIPiprazole (ABILIFY) 5 MG tablet Take 5 mg by mouth 3 (three) times daily.   Patient not taking: No sig reported    [provider]  atomoxetine (STRATTERA) 100 MG capsule Take 50 mg by mouth 2 (two) times daily.   Patient not taking: No sig reported    [provider]  HYDROcodone-acetaminophen (NORCO/VICODIN) 5-325 MG tablet Take 1 tablet by mouth every 4 (four) hours as needed for moderate pain. Patient not taking:  No sig reported 11/16/20   Gilda Crease, MD  methylphenidate (CONCERTA) 54 MG CR tablet Take 54 mg by mouth 2 (two) times daily.   Patient not taking: No sig reported    [provider]  methylphenidate (RITALIN LA) 20 MG 24 hr capsule Take 20 mg by mouth every morning.   Patient not taking: No sig reported    [provider]  tamsulosin (FLOMAX) 0.4 MG CAPS capsule Take 1 capsule (0.4 mg total) by mouth daily. Patient not taking: No sig reported 11/16/20   Pollina, Canary Brim, MD  UNABLE TO FIND Med Name:  Patient not taking: No sig reported     [provider]    Allergies    Patient has no known allergies.  Review of Systems   Review of Systems  Constitutional: Negative for chills and fever.  Skin: Positive for color change.    Physical Exam Updated Vital Signs BP 134/90   Pulse (!) 105   Temp 98.2 F (36.8 C)   Resp 19   Ht 5\' 7"  (1.702 m)   Wt 127.7 kg   SpO2 98%   BMI 44.09 kg/m   Physical Exam Vitals and nursing note reviewed.  Constitutional:      General: He is not in acute distress.    Appearance: Normal appearance. He is well-developed. He is not ill-appearing or diaphoretic.  HENT:     Head: Normocephalic and atraumatic.  Eyes:     General:        Right eye: No discharge.        Left eye: No discharge.  Pulmonary:     Effort: Pulmonary effort is normal. No respiratory distress.  Musculoskeletal:     Comments: Right index finger with paronychia over the radial side of the nail bed, some surrounding erythema down to DIP jointdigit pad soft, no felon. Full ROM, normal sensation and cap refill.  Skin:    General: Skin is warm and dry.  Neurological:     Mental Status: He is alert and oriented to person, place, and time.     Coordination: Coordination normal.  Psychiatric:        Behavior: Behavior normal.     ED Results / Procedures / Treatments   Labs (all labs ordered are listed, but only abnormal results are displayed) Labs Reviewed - No data to display  EKG None  Radiology DG Finger Index Right  Result Date: 01/29/2021 CLINICAL DATA:  Right index finger pain, redness EXAM: RIGHT INDEX FINGER 2+V COMPARISON:  None. FINDINGS: There is no evidence of fracture or dislocation. There is no evidence of arthropathy or other focal bone abnormality. Soft tissues are unremarkable. IMPRESSION: Negative. Electronically Signed   By: 01/31/2021 M.D.   On: 01/29/2021 21:37    Procedures .04/23/2022Incision and Drainage  Date/Time: 01/29/2021 11:53 PM Performed by: 01/31/2021,  PA-C Authorized by: Dartha Lodge, PA-C   Consent:    Consent obtained:  Verbal   Consent given by:  Patient and parent   Risks, benefits, and alternatives were discussed: yes     Risks discussed:  Bleeding, incomplete drainage, pain, infection and damage to other organs   Alternatives discussed:  No treatment Universal protocol:    Procedure explained and questions answered to patient or proxy's satisfaction: yes     Patient identity confirmed:  Verbally with patient Location:    Type:  Abscess (Paronychiia)   Size:  2 cm   Location:  Upper  extremity   Upper extremity location:  Finger   Finger location:  R index finger Pre-procedure details:    Skin preparation:  Chlorhexidine with alcohol Sedation:    Sedation type:  None Anesthesia:    Anesthesia method:  Local infiltration   Local anesthetic:  Lidocaine 1% w/o epi Procedure type:    Complexity:  Simple Procedure details:    Ultrasound guidance: no     Needle aspiration: no     Incision types:  Single straight   Incision depth:  Dermal   Wound management:  Probed and deloculated   Drainage:  Purulent   Drainage amount:  Copious   Wound treatment:  Wound left open   Packing materials:  None Post-procedure details:    Procedure completion:  Tolerated well, no immediate complications     Medications Ordered in ED Medications  lidocaine (PF) (XYLOCAINE) 1 % injection 10 mL (10 mLs Infiltration Given 01/29/21 2154)  doxycycline (VIBRA-TABS) tablet 100 mg (100 mg Oral Given 01/29/21 2229)    ED Course  I have reviewed the triage vital signs and the nursing notes.  Pertinent labs & imaging results that were available during my care of the patient were reviewed by me and considered in my medical decision making (see chart for details).    MDM Rules/Calculators/A&P                          Exam consistent with paronychia to the right index finger.  No concern for felon.  Area amenable to incision and drainage, the  area was cleaned, anesthetized with digital block and opened with a small incision along the nailbed.  Copious amounts of purulent drainage expressed.  The area was cleaned, dressing was placed and patient started on doxycycline.  Wound care instructions provided and patient encouraged to do warm soaks at least 3 times daily.  Strict return precautions provided and PCP follow-up encouraged.  Patient and mother expressed understanding and agreement.  Discharged home in good condition.  Final Clinical Impression(s) / ED Diagnoses Final diagnoses:  Paronychia of finger, right    Rx / DC Orders ED Discharge Orders         Ordered    doxycycline (VIBRAMYCIN) 100 MG capsule  2 times daily        01/29/21 2221           Dartha Lodge, PA-C 01/30/21 0000    Pricilla Loveless, MD 01/31/21 [phone]

## 2021-01-29 NOTE — ED Triage Notes (Signed)
Pt c/o right index finger pain and redness x 1 week.

## 2021-03-04 ENCOUNTER — Other Ambulatory Visit: Payer: Self-pay

## 2021-03-04 ENCOUNTER — Emergency Department (HOSPITAL_COMMUNITY)
Admission: EM | Admit: 2021-03-04 | Discharge: 2021-03-05 | Disposition: A | Discharge status: Home / Self Care | Payer: Medicare Other | Attending: Emergency Medicine | Admitting: Emergency Medicine

## 2021-03-04 ENCOUNTER — Encounter (HOSPITAL_COMMUNITY): Payer: Self-pay

## 2021-03-04 DIAGNOSIS — F82 Specific developmental disorder of motor function: Secondary | ICD-10-CM | POA: Diagnosis not present

## 2021-03-04 DIAGNOSIS — R2231 Localized swelling, mass and lump, right upper limb: Secondary | ICD-10-CM | POA: Diagnosis present

## 2021-03-04 DIAGNOSIS — R030 Elevated blood-pressure reading, without diagnosis of hypertension: Secondary | ICD-10-CM | POA: Insufficient documentation

## 2021-03-04 DIAGNOSIS — F840 Autistic disorder: Secondary | ICD-10-CM | POA: Insufficient documentation

## 2021-03-04 DIAGNOSIS — E119 Type 2 diabetes mellitus without complications: Secondary | ICD-10-CM | POA: Diagnosis not present

## 2021-03-04 DIAGNOSIS — L03011 Cellulitis of right finger: Secondary | ICD-10-CM | POA: Insufficient documentation

## 2021-03-04 NOTE — ED Triage Notes (Signed)
Pt presents with mom- pt has autism- pt has swelling to left middle finger from "biting nails" Redness swelling present.

## 2021-03-05 MED ORDER — LIDOCAINE HCL (PF) 2 % IJ SOLN
10.0000 mL | Freq: Once | INTRAMUSCULAR | Status: AC
  Administered 2021-03-05: 10 mL

## 2021-03-05 NOTE — ED Provider Notes (Signed)
[facility] EMERGENCY DEPARTMENT Provider Note   CSN: [id number] Arrival date & time: 03/04/21  2238     History Chief Complaint  Patient presents with  . Finger Injury    Middle finger-cuticle inflamed from biting nails    Gregory Kelley is a 25 y.o. male.  The history is provided by the patient and a parent. The history is limited by the condition of the patient (Autism).  He has history of diabetes, autism and comes in with painful swelling of his left third finger.  Mother noticed it today.  There has been no known fever.  Mother states that he tends to chew his nails.   Past Medical History:  Diagnosis Date  . Autism   . Diabetes mellitus   . Fetal alcohol syndrome     Patient Active Problem List   Diagnosis Date Noted  . 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 Developmental expressive language disorder 07/26/2011  . Diabetes mellitus, type II (HCC) 07/26/2011  . 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 Oppositional defiant disorder 07/26/2011  . Overweight 07/26/2011  . 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 HERNIA REPAIR         Family History  Adopted: Yes    Social History   Tobacco Use  . Smoking status: Never Smoker  . Smokeless tobacco: Never Used  Substance Use Topics  . Alcohol use: Yes    Comment: occasionally   . Drug use: No    Home Medications Prior to Admission medications   Medication Sig Start Date End Date Taking? Authorizing Provider  amphetamine-dextroamphetamine (ADDERALL) 30 MG tablet Take 1 tablet by mouth 2 (two) times daily. 10/14/20   [provider]  ARIPiprazole (ABILIFY) 5 MG tablet Take 5 mg by mouth 3 (three) times daily.    Patient not taking: No sig reported    [provider]  atomoxetine (STRATTERA) 100 MG capsule Take 50 mg by mouth 2 (two) times daily.   Patient not taking: No sig reported    [provider]  doxycycline (VIBRAMYCIN) 100 MG capsule Take 1 capsule (100 mg total) by mouth 2 (two) times daily. 01/29/21   Dartha Lodge, PA-C  HYDROcodone-acetaminophen (NORCO/VICODIN) 5-325 MG tablet Take 1 tablet by mouth every 4 (four) hours as needed for moderate pain. Patient not taking: No sig reported 11/16/20   Gilda Crease, MD  ibuprofen (ADVIL) 800 MG tablet Take 800 mg by mouth every 6 (six) hours as needed for moderate pain or headache. 12/12/20   [provider]  methylphenidate (CONCERTA) 54 MG CR tablet Take 54 mg by mouth 2 (two) times daily.   Patient not taking: No sig reported    [provider]  methylphenidate (RITALIN LA) 20 MG 24 hr capsule Take 20 mg by mouth every morning.   Patient not taking: No sig reported    [provider]  Multiple Vitamin (MULTI-VITAMIN) tablet Take 1 tablet by mouth daily.    [provider]  ondansetron (ZOFRAN) 4 MG tablet Take 1 tablet (4 mg total) by mouth every 6 (six) hours as needed for nausea or vomiting. 11/16/20   Gilda Crease, MD  tamsulosin (FLOMAX) 0.4 MG CAPS capsule Take 1 capsule (0.4 mg total) by mouth daily.  Patient not taking: No sig reported 11/16/20   Pollina, Canary Brim, MD  UNABLE TO FIND Med Name: **  Patient not taking: No sig reported    [provider]  VYVANSE 70 MG capsule Take 70 mg by mouth every morning. 01/29/21   [provider]    Allergies    Patient has no known allergies.  Review of Systems   Review of Systems  Unable to perform ROS: Psychiatric disorder    Physical Exam Updated Vital Signs BP (!) 158/96 (BP Location: Right Arm)   Pulse 100   Temp 98 F (36.7 C) (Oral)   Resp 14   Ht 5\' 7"  (1.702 m)   Wt 127.5 kg   SpO2 100%    BMI 44.01 kg/m   Physical Exam Vitals and nursing note reviewed.   25 year old male, resting comfortably and in no acute distress. Vital signs are significant for elevated blood pressure. Oxygen saturation is 100%, which is normal. Head is normocephalic and atraumatic. PERRLA, EOMI.  Neck is nontender and supple without adenopathy or JVD. Back is nontender and there is no CVA tenderness. Lungs are clear without rales, wheezes, or rhonchi. Chest is nontender. Heart has regular rate and rhythm without murmur. Abdomen is soft, flat, nontender. Extremities: Paronychia present left third finger. Skin is warm and dry without rash. Neurologic: Awake and alert, answers questions appropriately.  Moves all extremities equally.  ED Results / Procedures / Treatments    Procedures .34Incision and Drainage  Date/Time: 03/05/2021 2:48 AM Performed by: 03/07/2021, MD Authorized by: Dione Booze, MD   Consent:    Consent obtained:  Verbal   Consent given by:  Parent   Risks, benefits, and alternatives were discussed: yes     Risks discussed:  Bleeding, incomplete drainage and pain   Alternatives discussed:  Alternative treatment Universal protocol:    Procedure explained and questions answered to patient or proxy's satisfaction: yes     Relevant documents present and verified: yes     Required blood products, implants, devices, and special equipment available: yes     Site/side marked: yes     Immediately prior to procedure, a time out was called: yes     Patient identity confirmed:  Verbally with patient and arm band Location:    Type:  Abscess   Size:  1 cm   Location:  Upper extremity   Upper extremity location:  Finger   Finger location:  R long finger Sedation:    Sedation type:  None Anesthesia:    Anesthesia method:  Nerve block   Block location:  Right third finger   Block needle gauge:  25 G   Block anesthetic:  Lidocaine 2% w/o epi   Block technique:  Digital block    Block injection procedure:  Anatomic landmarks identified, introduced needle, incremental injection, negative aspiration for blood and anatomic landmarks palpated   Block outcome:  Anesthesia achieved Procedure type:    Complexity:  Complex Procedure details:    Ultrasound guidance: no     Needle aspiration: no     Incision types:  Single straight   Incision depth:  Subcutaneous   Wound management:  Probed and deloculated   Drainage:  Purulent   Drainage amount:  Moderate   Wound treatment:  Wound left open   Packing materials:  None Post-procedure details:    Procedure completion:  Tolerated well, no immediate complications     Medications Ordered in ED Medications - No  data to display  ED Course  I have reviewed the triage vital signs and the nursing notes.  MDM Rules/Calculators/A&P                         Paronychia of the left third finger treated with incision and drainage old records are reviewed, and he has a prior ED visit for paronychia of a finger as well as visit for paronychia of a toe.  Final Clinical Impression(s) / ED Diagnoses Final diagnoses:  Paronychia of right middle finger  Elevated blood-pressure reading without diagnosis of hypertension    Rx / DC Orders ED Discharge Orders    None       Dione Booze, MD 03/05/21 [id number]

## 2021-05-15 ENCOUNTER — Encounter (HOSPITAL_COMMUNITY): Payer: Self-pay

## 2021-05-15 ENCOUNTER — Emergency Department (HOSPITAL_COMMUNITY)
Admission: EM | Admit: 2021-05-15 | Discharge: 2021-05-15 | Disposition: A | Discharge status: Home / Self Care | Payer: Medicare Other | Attending: Emergency Medicine | Admitting: Emergency Medicine

## 2021-05-15 ENCOUNTER — Other Ambulatory Visit: Payer: Self-pay

## 2021-05-15 DIAGNOSIS — F840 Autistic disorder: Secondary | ICD-10-CM | POA: Insufficient documentation

## 2021-05-15 DIAGNOSIS — N4889 Other specified disorders of penis: Secondary | ICD-10-CM | POA: Diagnosis present

## 2021-05-15 DIAGNOSIS — E119 Type 2 diabetes mellitus without complications: Secondary | ICD-10-CM | POA: Diagnosis not present

## 2021-05-15 DIAGNOSIS — N481 Balanitis: Secondary | ICD-10-CM | POA: Insufficient documentation

## 2021-05-15 MED ORDER — CLOTRIMAZOLE 1 % EX CREA: TOPICAL_CREAM | CUTANEOUS | 1 refills | Status: DC

## 2021-05-15 MED ORDER — LIDOCAINE HCL URETHRAL/MUCOSAL 2 % EX GEL
1.0000 "application " | Freq: Once | CUTANEOUS | Status: AC
  Administered 2021-05-15: 1 via TOPICAL
  Filled 2021-05-15: qty 10

## 2021-05-15 NOTE — ED Triage Notes (Signed)
Pt here with mother. Mother states he came in her room complaining of his penis hurting that he cant sleep. He has been bothering him a couple weeks. Pt is autistic .

## 2021-05-15 NOTE — ED Provider Notes (Signed)
[facility]-EMERGENCY DEPT [facility] Emergency Department Provider Note MRN:  [id number]  Arrival date & time: 05/15/21     Chief Complaint   Penis pain History of Present Illness   Gregory Kelley is a 25 y.o. year-old male with a history of autism, diabetes presenting to the ED with chief complaint of penis pain.  Pain to the tip of the penis for the past few hours, unable to sleep.  No fever, no other complaints.  Pain is currently mild.  Review of Systems  A problem-focused ROS was performed. Positive for penile pain.  Patient denies fever.  Patient's Health History    Past Medical History:  Diagnosis Date   Autism    Diabetes mellitus    Fetal alcohol syndrome     Past Surgical History:  Procedure Laterality Date   HERNIA REPAIR      Family History  Adopted: Yes    Social History   Socioeconomic History   Marital status: Single    Spouse name: Not on file   Number of children: Not on file   Years of education: Not on file   Highest education level: Not on file  Occupational History   Not on file  Tobacco Use   Smoking status: Never   Smokeless tobacco: Never  Substance and Sexual Activity   Alcohol use: Yes    Comment: occasionally    Drug use: No   Sexual activity: Not on file  Other Topics Concern   Not on file  Social History Narrative   Not on file   Social Determinants of Health   Financial Resource Strain: Not on file  Food Insecurity: Not on file  Transportation Needs: Not on file  Physical Activity: Not on file  Stress: Not on file  Social Connections: Not on file  Intimate Partner Violence: Not on file     Physical Exam   Vitals:   05/15/21 0305  BP: 128/75  Pulse: 95  Resp: 16  Temp: 98.5 F (36.9 C)  SpO2: 97%    CONSTITUTIONAL: Well-appearing, NAD NEURO:  Alert and oriented x 3, no focal deficits EYES:  eyes equal and reactive ENT/NECK:  no LAD, no JVD CARDIO: Regular rate, well-perfused, normal S1 and S2 PULM:   CTAB no wheezing or rhonchi GI/GU:  normal bowel sounds, non-distended, non-tender; erythema to the glans, foreskin easily retracts MSK/SPINE:  No gross deformities, no edema SKIN:  no rash, atraumatic PSYCH:  Appropriate speech and behavior  *Additional and/or pertinent findings included in MDM below  Diagnostic and Interventional Summary    EKG Interpretation  Date/Time:    Ventricular Rate:    PR Interval:    QRS Duration:   QT Interval:    QTC Calculation:   R Axis:     Text Interpretation:         Labs Reviewed - No data to display  No orders to display    Medications  lidocaine (XYLOCAINE) 2 % jelly 1 application (has no administration in time range)     Procedures  /  Critical Care Procedures  ED Course and Medical Decision Making  I have reviewed the triage vital signs, the nursing notes, and pertinent available records from the EMR.  Listed above are laboratory and imaging tests that I personally ordered, reviewed, and interpreted and then considered in my medical decision making (see below for details).  Exam is consistent with balanitis.  There is no phimosis or paraphimosis, there is no sign of contiguous  spread of infection, no concern for bacterial cellulitis, no concern for systemic illness, no concern for Fournier's.  Appropriate for discharge on antifungal cream.       Elmer Sow. Pilar Plate, MD [facility] Emergency Medicine [facility] [email or web address]  Final Clinical Impressions(s) / ED Diagnoses     ICD-10-CM   1. Balanitis  N48.1       ED Discharge Orders          Ordered    clotrimazole (LOTRIMIN) 1 % cream        05/15/21 0316             Discharge Instructions Discussed with and Provided to Patient:    Discharge Instructions      You were evaluated in the Emergency Department and after careful evaluation, we did not find any emergent condition requiring admission or further testing in the hospital.  Your  exam/testing today was overall reassuring.  Symptoms seem to be due to balanitis.  Please apply the clotrimazole cream twice daily until the redness and pain goes away.  This may take several days, possibly a couple weeks.  Please return to the Emergency Department if you experience any worsening of your condition.  Thank you for allowing [place] to be a part of your care.        Sabas Sous, MD 05/15/21 [phone]

## 2021-05-15 NOTE — Discharge Instructions (Addendum)
You were evaluated in the Emergency Department and after careful evaluation, we did not find any emergent condition requiring admission or further testing in the hospital.  Your exam/testing today was overall reassuring.  Symptoms seem to be due to balanitis.  Please apply the clotrimazole cream twice daily until the redness and pain goes away.  This may take several days, possibly a couple weeks.  Please return to the Emergency Department if you experience any worsening of your condition.  Thank you for allowing [place] to be a part of your care.

## 2021-09-07 LAB — BASIC METABOLIC PANEL
BUN: 8 (ref 4–21)
Creatinine: 0.8 (ref 0.6–1.3)
Glucose: 498

## 2021-09-07 LAB — LIPID PANEL
LDL Cholesterol: 100
Triglycerides: 224 — AB (ref 40–160)

## 2021-09-07 LAB — COMPREHENSIVE METABOLIC PANEL: GFR calc non Af Amer: 126

## 2021-09-07 LAB — HEMOGLOBIN A1C: Hemoglobin A1C: 14

## 2021-09-30 ENCOUNTER — Other Ambulatory Visit: Payer: Self-pay

## 2021-09-30 ENCOUNTER — Ambulatory Visit (INDEPENDENT_AMBULATORY_CARE_PROVIDER_SITE_OTHER): Payer: Medicare Other | Admitting: Nurse Practitioner

## 2021-09-30 ENCOUNTER — Encounter: Payer: Self-pay | Admitting: Nurse Practitioner

## 2021-09-30 VITALS — BP 103/69 | HR 75 | Ht 67.5 in | Wt 220.0 lb

## 2021-09-30 DIAGNOSIS — E782 Mixed hyperlipidemia: Secondary | ICD-10-CM

## 2021-09-30 DIAGNOSIS — E1165 Type 2 diabetes mellitus with hyperglycemia: Secondary | ICD-10-CM

## 2021-09-30 MED ORDER — DEXCOM G6 SENSOR MISC: 3 refills | Status: DC

## 2021-09-30 MED ORDER — DEXCOM G6 TRANSMITTER MISC: 3 refills | Status: DC

## 2021-09-30 NOTE — Patient Instructions (Signed)

## 2021-09-30 NOTE — Progress Notes (Signed)
Endocrinology Consult Note       09/30/2021, 3:25 PM   Subjective:    Patient ID: Gregory Kelley, male    DOB: [birth year].  Gregory Kelley is being seen in consultation for management of currently uncontrolled symptomatic diabetes requested by  Gareth Morgan, MD.  He is accompanied today by his adopted mother due to his intellectual disability.   Past Medical History:  Diagnosis Date   Autism    Diabetes mellitus    Fetal alcohol syndrome     Past Surgical History:  Procedure Laterality Date   HERNIA REPAIR      Social History   Socioeconomic History   Marital status: Single    Spouse name: Not on file   Number of children: Not on file   Years of education: Not on file   Highest education level: Not on file  Occupational History   Not on file  Tobacco Use   Smoking status: Never   Smokeless tobacco: Never  Vaping Use   Vaping Use: Some days  Substance and Sexual Activity   Alcohol use: Yes    Comment: occasionally    Drug use: No   Sexual activity: Not on file  Other Topics Concern   Not on file  Social History Narrative   Not on file   Social Determinants of Health   Financial Resource Strain: Not on file  Food Insecurity: Not on file  Transportation Needs: Not on file  Physical Activity: Not on file  Stress: Not on file  Social Connections: Not on file    Family History  Adopted: Yes    Current Outpatient Medications on File Prior to Visit  Medication Sig Dispense Refill   amphetamine-dextroamphetamine (ADDERALL) 30 MG tablet Take 1 tablet by mouth 2 (two) times daily.     atorvastatin (LIPITOR) 20 MG tablet Take 20 mg by mouth daily.     Clobetasol Propionate 0.05 % shampoo SMARTSIG:Sparingly Topical Daily     ibuprofen (ADVIL) 800 MG tablet Take 800 mg by mouth every 6 (six) hours as needed for moderate pain or headache.     metFORMIN (GLUCOPHAGE-XR) 500 MG 24 hr  tablet SMARTSIG:1 Tablet(s) By Mouth     Multiple Vitamin (MULTI-VITAMIN) tablet Take 1 tablet by mouth daily.     VYVANSE 70 MG capsule Take 70 mg by mouth every morning.     No current facility-administered medications on file prior to visit.      ALLERGIES: No Known Allergies  VACCINATION STATUS:  There is no immunization history on file for this patient.  Diabetes He presents for his initial diabetic visit. He has type 2 diabetes mellitus. Onset time: newly diagnosed at age of 25. His disease course has been improving. There are no hypoglycemic associated symptoms. 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 There are no hypoglycemic complications. Symptoms are stable. There are no diabetic complications. Risk factors for coronary artery disease include diabetes mellitus, dyslipidemia, family history, male sex, obesity and sedentary lifestyle. Current diabetic treatment includes oral agent (dual therapy) (Metformin and Jardiance). He is compliant with treatment most of the time. His weight is  decreasing steadily. He is following a generally unhealthy diet. When asked about meal planning, he reported none. He has not had a previous visit with a dietitian. He rarely participates in exercise. His home blood glucose trend is decreasing steadily. (He presents today for his consultation, accompanied by his adopted mother, with no meter or logs to review.  He was recently diagnosed with diabetes about 3 weeks ago after being hospitalized for pneumonia at which time his A1c was > 14%.  He did not voice any suspicious symptoms of hyperglycemia to his mother (says he rarely complains about anything due to his autism).  He was started on Jardiance and Metformin at that time.  His mother reports he has been more fatigued since starting the Jardiance and he has continued to lose weight which is concerning her.  He does monitor glucose 3  times daily (since his diagnosis) typically in the 150s at this time.  He drinks mostly water and recently changed to sugar-free soda.  He eats 3 meals per day and snacks in between.  He does not engage in routine physical activity.  He is UTD on eye exam.) An ACE inhibitor/angiotensin II receptor blocker is not being taken. He does not see a podiatrist.Eye exam is current.  Hyperlipidemia This is a chronic problem. The current episode started more than 1 year ago. The problem is uncontrolled. Recent lipid tests were reviewed and are high. Exacerbating diseases include diabetes and obesity. Factors aggravating his hyperlipidemia include fatty foods. Current antihyperlipidemic treatment includes statins. Compliance problems include adherence to diet, adherence to exercise and psychosocial issues.  Risk factors for coronary artery disease include diabetes mellitus, dyslipidemia, family history, male sex, obesity and a sedentary lifestyle.    Review of systems  Constitutional: + drastically decreasing body weight, current Body mass index is 33.95 kg/m., + fatigue, no subjective hyperthermia, no subjective hypothermia Eyes: no blurry vision, no xerophthalmia, blind in R eye ENT: no sore throat, no nodules palpated in throat, no dysphagia/odynophagia, no hoarseness Cardiovascular: no chest pain, no shortness of breath, no palpitations, no leg swelling Respiratory: no cough, no shortness of breath Gastrointestinal: no nausea/vomiting/diarrhea Musculoskeletal: no muscle/joint aches Skin: no rashes, no hyperemia Neurological: no tremors, no numbness, no tingling, no dizziness Psychiatric: no depression, no anxiety, hx of intellectual disability (autistic)  Objective:     BP 103/69    Pulse 75    Ht 5' 7.5" (1.715 m)    Wt 220 lb (99.8 kg)    SpO2 97%    BMI 33.95 kg/m   Wt Readings from Last 3 Encounters:  09/30/21 220 lb (99.8 kg)  05/15/21 281 lb 1.4 oz (127.5 kg)  03/04/21 281 lb (127.5 kg)      BP Readings from Last 3 Encounters:  09/30/21 103/69  05/15/21 128/75  03/05/21 121/89     Physical Exam- Limited  Constitutional:  Body mass index is 33.95 kg/m. , not in acute distress, normal state of mind Eyes:  EOMI, no exophthalmos Neck: Supple Cardiovascular: RRR, no murmurs, rubs, or gallops, no edema Respiratory: Adequate breathing efforts, no crackles, rales, rhonchi, or wheezing Musculoskeletal: no gross deformities, strength intact in all four extremities, no gross restriction of joint movements Skin:  no rashes, no hyperemia Neurological: no tremor with outstretched hands    CMP ( most recent) CMP     Component Value Date/Time   NA 139 11/16/2020 0312   K 3.5 11/16/2020 0312   CL 105 11/16/2020 0312   CO2 26  11/16/2020 0312   GLUCOSE 133 (H) 11/16/2020 0312   BUN 8 09/07/2021 0000   CREATININE 0.8 09/07/2021 0000   CREATININE 0.71 11/16/2020 0312   CALCIUM 9.6 11/16/2020 0312   PROT 7.5 11/16/2020 0312   ALBUMIN 4.1 11/16/2020 0312   AST 42 (H) 11/16/2020 0312   ALT 111 (H) 11/16/2020 0312   ALKPHOS 88 11/16/2020 0312   BILITOT 0.9 11/16/2020 0312   GFRNONAA 126 09/07/2021 0000   GFRNONAA >60 11/16/2020 0312     Diabetic Labs (most recent): Lab Results  Component Value Date   HGBA1C 14 09/07/2021     Lipid Panel ( most recent) Lipid Panel     Component Value Date/Time   TRIG 224 (A) 09/07/2021 0000   LDLCALC 100 09/07/2021 0000      No results found for: TSH, FREET4         Assessment & Plan:   1) Type 2 diabetes mellitus with hyperglycemia, without long-term current use of insulin (HCC)  He presents today for his consultation, accompanied by his adopted mother, with no meter or logs to review.  He was recently diagnosed with diabetes about 3 weeks ago after being hospitalized for pneumonia at which time his A1c was > 14%.  He did not voice any suspicious symptoms of hyperglycemia to his mother (says he rarely complains about  anything due to his autism).  He was started on Jardiance and Metformin at that time.  His mother reports he has been more fatigued since starting the Jardiance and he has continued to lose weight which is concerning her.  He does monitor glucose 3 times daily (since his diagnosis) typically in the 150s at this time.  He drinks mostly water and recently changed to sugar-free soda.  He eats 3 meals per day and snacks in between.  He does not engage in routine physical activity.  He is UTD on eye exam.  - JEHAD BISONO has currently uncontrolled symptomatic type 2 DM since 25 years of age, with most recent A1c of 14 %.   -Recent labs reviewed.  - I had a long discussion with him about the progressive nature of diabetes and the pathology behind its complications. -his diabetes is not currently complicated but he remains at a high risk for more acute and chronic complications which include CAD, CVA, CKD, retinopathy, and neuropathy. These are all discussed in detail with him.  - I have counseled him on diet and weight management by adopting a carbohydrate restricted/protein rich diet. Patient is encouraged to switch to unprocessed or minimally processed complex starch and increased protein intake (animal or plant source), fruits, and vegetables. -  he is advised to stick to a routine mealtimes to eat 3 meals a day and avoid unnecessary snacks (to snack only to correct hypoglycemia).   - he acknowledges that there is a room for improvement in his food and drink choices. - Suggestion is made for him to avoid simple carbohydrates from his diet including Cakes, Sweet Desserts, Ice Cream, Soda (diet and regular), Sweet Tea, Candies, Chips, Cookies, Store Bought Juices, Alcohol in Excess of 1-2 drinks a day, Artificial Sweeteners, Coffee Creamer, and "Sugar-free" Products. This will help patient to have more stable blood glucose profile and potentially avoid unintended weight gain.  - he will be scheduled  with Norm Salt, RDN, CDE for diabetes education.  He already has appt scheduled with her in the coming weeks.  - I have approached him with the following individualized  plan to manage his diabetes and patient agrees:   -He is advised to continue Metformin 500 mg ER twice daily with meals.  -he is encouraged to start monitoring glucose 4 times daily, before meals and before bed, to log their readings on the clinic sheets provided, and bring them to review at follow up appointment in 2 weeks.  He is an excellent candidate for CGM device such as Dexcom given his hypoglycemic unawareness and intellectual disability so that his mother can have access to his glucose readings when they are away from each other.  Will send order to local pharmacy.  I did give a sample to them today.  - Adjustment parameters are given to him for hypo and hyperglycemia in writing. - he is encouraged to call clinic for blood glucose levels less than 70 or above 300 mg /dl.  - his [place] Pepper will be discontinued, risk outweighs benefit for this patient.  - he will be considered for incretin therapy as appropriate next visit, although should be done so cautiously given his high triglycerides increasing his risk of pancreatitis.  - Specific targets for  A1c; LDL, HDL, and Triglycerides were discussed with the patient.  2) Blood Pressure /Hypertension:  his blood pressure is controlled to target without the use of antihypertensive medications.     3) Lipids/Hyperlipidemia:    Review of his recent lipid panel from 09/07/21 showed uncontrolled LDL at 100 and elevated triglycerides of 224 .  he is advised to continue Atorvastatin 20 mg daily at bedtime.  Side effects and precautions discussed with him.  4)  Weight/Diet:  his Body mass index is 33.95 kg/m.  -  clearly complicating his diabetes care.   he is a candidate for weight loss. I discussed with him the fact that loss of 5 - 10% of his  current body weight will have  the most impact on his diabetes management.  Exercise, and detailed carbohydrates information provided  -  detailed on discharge instructions.  5) Chronic Care/Health Maintenance: -he is not on ACEI/ARB and was recently started on Statin medications and is encouraged to initiate and continue to follow up with Ophthalmology, Dentist, Podiatrist at least yearly or according to recommendations, and advised to stay away from smoking. I have recommended yearly flu vaccine and pneumonia vaccine at least every 5 years; moderate intensity exercise for up to 150 minutes weekly; and sleep for at least 7 hours a day.  - he is advised to maintain close follow up with Gareth Morgan, MD for primary care needs, as well as his other providers for optimal and coordinated care.   - Time spent in this patient care: 60 min, of which > 50% was spent in counseling him about his diabetes and the rest reviewing his blood glucose logs, discussing his hypoglycemia and hyperglycemia episodes, reviewing his current and previous labs/studies (including abstraction from other facilities) and medications doses and developing a long term treatment plan based on the latest standards of care/guidelines; and documenting his care.    Please refer to Patient Instructions for Blood Glucose Monitoring and Insulin/Medications Dosing Guide" in media tab for additional information. Please also refer to "Patient Self Inventory" in the Media tab for reviewed elements of pertinent patient history.  Micheal Likens participated in the discussions, expressed understanding, and voiced agreement with the above plans.  All questions were answered to his satisfaction. he is encouraged to contact clinic should he have any questions or concerns prior to his return visit.  Follow up plan: - Return in about 2 weeks (around 10/14/2021) for Diabetes F/U, Bring meter and logs.    Ronny Bacon, [facility] [address] [place], [place] [address] Phone: [phone] Fax: [phone]  09/30/2021, 3:25 PM

## 2021-10-01 ENCOUNTER — Other Ambulatory Visit: Payer: Self-pay | Admitting: Nurse Practitioner

## 2021-10-01 MED ORDER — GLIPIZIDE ER 5 MG PO TB24: 5.0000 mg | ORAL_TABLET | Freq: Every day | ORAL | 3 refills | Status: DC

## 2021-10-01 NOTE — Progress Notes (Signed)
On call nurse line reached out for concerns with high blood glucose readings over 300 since yesterday.  He is only on Metformin 500 mg ER twice daily.  They were using their Dexcom device for the readings.  I asked they get fingerstick glucose to verify correct readings as well.  I did send in for Glipizide 5 mg XL daily with breakfast to help manage postprandial hyperglycemia.  I advised them to reach back out in a few days if blood glucose readings are still significantly elevated.

## 2021-10-14 NOTE — Patient Instructions (Signed)

## 2021-10-15 ENCOUNTER — Ambulatory Visit (INDEPENDENT_AMBULATORY_CARE_PROVIDER_SITE_OTHER): Payer: Medicare Other | Admitting: Nurse Practitioner

## 2021-10-15 ENCOUNTER — Other Ambulatory Visit: Payer: Self-pay

## 2021-10-15 ENCOUNTER — Encounter: Payer: Self-pay | Admitting: Nurse Practitioner

## 2021-10-15 VITALS — BP 119/73 | HR 78 | Ht 67.5 in | Wt 230.4 lb

## 2021-10-15 DIAGNOSIS — E1165 Type 2 diabetes mellitus with hyperglycemia: Secondary | ICD-10-CM | POA: Diagnosis not present

## 2021-10-15 DIAGNOSIS — E782 Mixed hyperlipidemia: Secondary | ICD-10-CM | POA: Diagnosis not present

## 2021-10-15 NOTE — Progress Notes (Signed)
Endocrinology Follow Up Note       10/15/2021, 3:52 PM   Subjective:    Patient ID: Gregory Kelley, male    DOB: 1996/01/27.  Gregory Kelley is being seen in follow up after being seen in consultation for management of currently uncontrolled symptomatic diabetes requested by  Lemmie Evens, MD.  He is accompanied today by his adopted mother due to his intellectual disability.   Past Medical History:  Diagnosis Date   Autism    Diabetes mellitus    Fetal alcohol syndrome     Past Surgical History:  Procedure Laterality Date   HERNIA REPAIR      Social History   Socioeconomic History   Marital status: Single    Spouse name: Not on file   Number of children: Not on file   Years of education: Not on file   Highest education level: Not on file  Occupational History   Not on file  Tobacco Use   Smoking status: Never   Smokeless tobacco: Never  Vaping Use   Vaping Use: Some days  Substance and Sexual Activity   Alcohol use: Yes    Comment: occasionally    Drug use: No   Sexual activity: Not on file  Other Topics Concern   Not on file  Social History Narrative   Not on file   Social Determinants of Health   Financial Resource Strain: Not on file  Food Insecurity: Not on file  Transportation Needs: Not on file  Physical Activity: Not on file  Stress: Not on file  Social Connections: Not on file    Family History  Adopted: Yes    Current Outpatient Medications on File Prior to Visit  Medication Sig Dispense Refill   amphetamine-dextroamphetamine (ADDERALL) 30 MG tablet Take 1 tablet by mouth 2 (two) times daily.     atorvastatin (LIPITOR) 20 MG tablet Take 20 mg by mouth daily.     Clobetasol Propionate 0.05 % shampoo SMARTSIG:Sparingly Topical Daily     glipiZIDE (GLUCOTROL XL) 5 MG 24 hr tablet Take 1 tablet (5 mg total) by mouth daily with breakfast. 90 tablet 3   ibuprofen  (ADVIL) 800 MG tablet Take 800 mg by mouth every 6 (six) hours as needed for moderate pain or headache.     metFORMIN (GLUCOPHAGE-XR) 500 MG 24 hr tablet Take 1,000 mg by mouth daily with breakfast.     Multiple Vitamin (MULTI-VITAMIN) tablet Take 1 tablet by mouth daily.     VYVANSE 70 MG capsule Take 70 mg by mouth every morning.     Continuous Blood Gluc Sensor (DEXCOM G6 SENSOR) MISC Change sensor every 10 days as directed (Patient not taking: Reported on 10/15/2021) 9 each 3   Continuous Blood Gluc Transmit (DEXCOM G6 TRANSMITTER) MISC Change transmitter every 90 days as directed. (Patient not taking: Reported on 10/15/2021) 1 each 3   No current facility-administered medications on file prior to visit.      ALLERGIES: No Known Allergies  VACCINATION STATUS:  There is no immunization history on file for this patient.  Diabetes He presents for his follow-up diabetic visit. He has type 2 diabetes mellitus. Onset time: newly  diagnosed at age of 26. His disease course has been improving. There are no hypoglycemic associated symptoms. 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 Pertinent negatives for diabetes include no weight loss. There are no hypoglycemic complications. Symptoms are stable. There are no diabetic complications. Risk factors for coronary artery disease include diabetes mellitus, dyslipidemia, family history, male sex, obesity and sedentary lifestyle. Current diabetic treatment includes oral agent (dual therapy). He is compliant with treatment most of the time. His weight is fluctuating minimally. He is following a generally unhealthy diet. When asked about meal planning, he reported none. He has not had a previous visit with a dietitian. He rarely participates in exercise. His home blood glucose trend is decreasing steadily. His breakfast blood glucose range is generally >200 mg/dl. His bedtime blood glucose range is generally  >200 mg/dl. His overall blood glucose range is >200 mg/dl. (He presents today, accompanied by his adopted mom, with his meter and logs showing persistently high but slowly improving.  He was not due for another A1c today.  He did call after hours line for hyperglycemia in which Glipizide was started.  There are no episodes of hypoglycemia documented.  Analysis of his meter shows 7-day average of 253, 14-day average of 278 and his CGM shows TIR 3%, TAR 97%, TBR 0%.  ) An ACE inhibitor/angiotensin II receptor blocker is not being taken. He does not see a podiatrist.Eye exam is current.  Hyperlipidemia This is a chronic problem. The current episode started more than 1 year ago. The problem is uncontrolled. Recent lipid tests were reviewed and are high. Exacerbating diseases include diabetes and obesity. Factors aggravating his hyperlipidemia include fatty foods. Current antihyperlipidemic treatment includes statins. Compliance problems include adherence to diet, adherence to exercise and psychosocial issues.  Risk factors for coronary artery disease include diabetes mellitus, dyslipidemia, family history, male sex, obesity and a sedentary lifestyle.    Review of systems  Constitutional: + stable body weight, current Body mass index is 35.55 kg/m., + fatigue, no subjective hyperthermia, no subjective hypothermia Eyes: no blurry vision, no xerophthalmia, blind in R eye ENT: no sore throat, no nodules palpated in throat, no dysphagia/odynophagia, no hoarseness Cardiovascular: no chest pain, no shortness of breath, no palpitations, no leg swelling Respiratory: no cough, no shortness of breath Gastrointestinal: no nausea/vomiting/diarrhea Musculoskeletal: no muscle/joint aches Skin: no rashes, no hyperemia Neurological: no tremors, no numbness, no tingling, no dizziness Psychiatric: no depression, no anxiety, hx of intellectual disability (autistic)  Objective:     BP 119/73    Pulse 78    Ht 5' 7.5"  (1.715 m)    Wt 230 lb 6.4 oz (104.5 kg)    BMI 35.55 kg/m   Wt Readings from Last 3 Encounters:  10/15/21 230 lb 6.4 oz (104.5 kg)  09/30/21 220 lb (99.8 kg)  05/15/21 281 lb 1.4 oz (127.5 kg)     BP Readings from Last 3 Encounters:  10/15/21 119/73  09/30/21 103/69  05/15/21 128/75     Physical Exam- Limited  Constitutional:  Body mass index is 35.55 kg/m. , not in acute distress, normal state of mind Eyes:  EOMI, no exophthalmos Neck: Supple Cardiovascular: RRR, no murmurs, rubs, or gallops, no edema Respiratory: Adequate breathing efforts, no crackles, rales, rhonchi, or wheezing Musculoskeletal: no gross deformities, strength intact in all four extremities, no gross restriction of joint movements Skin:  no rashes, no hyperemia Neurological: no tremor with outstretched hands   POCT ABI  Results 10/15/21   Right ABI:  1.15      Left ABI:  1.13  Right leg systolic / diastolic: Q000111Q mmHg Left leg systolic / diastolic: A999333 mmHg  Arm systolic / diastolic: 99991111 mmHG  Detailed report will be scanned into patient chart. CMP ( most recent) CMP     Component Value Date/Time   NA 139 11/16/2020 0312   K 3.5 11/16/2020 0312   CL 105 11/16/2020 0312   CO2 26 11/16/2020 0312   GLUCOSE 133 (H) 11/16/2020 0312   BUN 8 09/07/2021 0000   CREATININE 0.8 09/07/2021 0000   CREATININE 0.71 11/16/2020 0312   CALCIUM 9.6 11/16/2020 0312   PROT 7.5 11/16/2020 0312   ALBUMIN 4.1 11/16/2020 0312   AST 42 (H) 11/16/2020 0312   ALT 111 (H) 11/16/2020 0312   ALKPHOS 88 11/16/2020 0312   BILITOT 0.9 11/16/2020 0312   GFRNONAA 126 09/07/2021 0000   GFRNONAA >60 11/16/2020 0312     Diabetic Labs (most recent): Lab Results  Component Value Date   HGBA1C 14 09/07/2021     Lipid Panel ( most recent) Lipid Panel     Component Value Date/Time   TRIG 224 (A) 09/07/2021 0000   LDLCALC 100 09/07/2021 0000      No results found for: TSH, FREET4         Assessment &  Plan:   1) Type 2 diabetes mellitus with hyperglycemia, without long-term current use of insulin ([place])  He presents today, accompanied by his adopted mom, with his meter and logs showing persistently high but slowly improving.  He was not due for another A1c today.  He did call after hours line for hyperglycemia in which Glipizide was started.  There are no episodes of hypoglycemia documented.  Analysis of his meter shows 7-day average of 253, 14-day average of 278 and his CGM shows TIR 3%, TAR 97%, TBR 0%.    - Cathren Laine has currently uncontrolled symptomatic type 2 DM since 26 years of age, with most recent A1c of 14 %.   -Recent labs reviewed.  - I had a long discussion with him about the progressive nature of diabetes and the pathology behind its complications. -his diabetes is not currently complicated but he remains at a high risk for more acute and chronic complications which include CAD, CVA, CKD, retinopathy, and neuropathy. These are all discussed in detail with him.  - Nutritional counseling repeated at each appointment due to patients tendency to fall back in to old habits.  - The patient admits there is a room for improvement in their diet and drink choices. -  Suggestion is made for the patient to avoid simple carbohydrates from their diet including Cakes, Sweet Desserts / Pastries, Ice Cream, Soda (diet and regular), Sweet Tea, Candies, Chips, Cookies, Sweet Pastries, Store Bought Juices, Alcohol in Excess of 1-2 drinks a day, Artificial Sweeteners, Coffee Creamer, and "Sugar-free" Products. This will help patient to have stable blood glucose profile and potentially avoid unintended weight gain.   - I encouraged the patient to switch to unprocessed or minimally processed complex starch and increased protein intake (animal or plant source), fruits, and vegetables.   - Patient is advised to stick to a routine mealtimes to eat 3 meals a day and avoid unnecessary snacks (to  snack only to correct hypoglycemia).  - he will be scheduled with Jearld Fenton, RDN, CDE for diabetes education.  He already has appt scheduled with her in the  coming weeks.  - I have approached him with the following individualized plan to manage his diabetes and patient agrees:   -He is advised to continue Metformin 1000 mg ER once daily with breakfast and Glipizide 5 mg XL daily with breakfast.  -he is encouraged to start monitoring glucose at least twice daily, before breakfast and before bed, and to call the clinic if he has readings less than 70 or above 300 for 3 tests in a row.   He is an excellent candidate for CGM device such as Dexcom given his hypoglycemic unawareness and intellectual disability so that his mother can have access to his glucose readings when they are away from each other.  Awaiting insurance determination to cover.  - Adjustment parameters are given to him for hypo and hyperglycemia in writing.  - his Vania Rea will be discontinued, risk outweighs benefit for this patient.  - he will be considered for incretin therapy as appropriate next visit, although should be done so cautiously given his high triglycerides increasing his risk of pancreatitis.  - Specific targets for  A1c; LDL, HDL, and Triglycerides were discussed with the patient.  2) Blood Pressure /Hypertension:  his blood pressure is controlled to target without the use of antihypertensive medications.     3) Lipids/Hyperlipidemia:    Review of his recent lipid panel from 09/07/21 showed uncontrolled LDL at 100 and elevated triglycerides of 224 .  he is advised to continue Atorvastatin 20 mg daily at bedtime.  Side effects and precautions discussed with him.  4)  Weight/Diet:  his Body mass index is 35.55 kg/m.  -  clearly complicating his diabetes care.   he is a candidate for weight loss. I discussed with him the fact that loss of 5 - 10% of his  current body weight will have the most impact on his  diabetes management.  Exercise, and detailed carbohydrates information provided  -  detailed on discharge instructions.  5) Chronic Care/Health Maintenance: -he is not on ACEI/ARB and was recently started on Statin medications and is encouraged to initiate and continue to follow up with Ophthalmology, Dentist, Podiatrist at least yearly or according to recommendations, and advised to stay away from smoking. I have recommended yearly flu vaccine and pneumonia vaccine at least every 5 years; moderate intensity exercise for up to 150 minutes weekly; and sleep for at least 7 hours a day.  - he is advised to maintain close follow up with Lemmie Evens, MD for primary care needs, as well as his other providers for optimal and coordinated care.     I spent 40 minutes in the care of the patient today including review of labs from [place], Lipids, Thyroid Function, Hematology (current and previous including abstractions from other facilities); face-to-face time discussing  his blood glucose readings/logs, discussing hypoglycemia and hyperglycemia episodes and symptoms, medications doses, his options of short and long term treatment based on the latest standards of care / guidelines;  discussion about incorporating lifestyle medicine;  and documenting the encounter.    Please refer to Patient Instructions for Blood Glucose Monitoring and Insulin/Medications Dosing Guide"  in media tab for additional information. Please  also refer to " Patient Self Inventory" in the Media  tab for reviewed elements of pertinent patient history.  Cathren Laine participated in the discussions, expressed understanding, and voiced agreement with the above plans.  All questions were answered to his satisfaction. he is encouraged to contact clinic should he have any questions or concerns  prior to his return visit.     Follow up plan: - Return in about 3 months (around 01/13/2022) for Diabetes F/U- A1c and UM in office, No  previsit labs, Bring meter and logs.   Rayetta Pigg, [facility] [address] [place], [place] [address] Phone: [phone] Fax: [phone]  10/15/2021, 3:52 PM

## 2021-10-26 ENCOUNTER — Telehealth: Payer: Self-pay | Admitting: Nurse Practitioner

## 2021-10-26 NOTE — Telephone Encounter (Signed)
Patient's mother also stated she needs to discuss with someone about his readings when you call back

## 2021-10-26 NOTE — Telephone Encounter (Signed)
Called his mom Lupita Leash back and gave her the message from [place]. Lupita Leash stated that she has not been buying the sweets and carbs that she had been buying and they are trying to eat healthier. Lupita Leash stated that she is buying Kind Bars for a snack and the [employer] and they are using wraps instead of bread for sandwiches now. Patient verbalized an understanding and will go over with Boyd Kerbs at their appointment.

## 2021-10-26 NOTE — Telephone Encounter (Signed)
Called patients mother and she stated that her son has had elevated blood glucose readings for the past few days. This morning his blood sugar at 9:30am was 165.  Yesterday AM=200, Lunch=348, Dinner=344, and Bedtime=400 Day before yesterday AM was 297 and was over 300 from lunch to bedtime. Mother stated that when she has her headaches it is hard for her to watch what he eats.  I have not seen anything on his Dexcom and called [facility] and they stated that the PA was denied and this may need to go to a mail order pharmacy. Also was given the number to [place] [phone] for possible supplies for Dexcom.  Please advise.

## 2021-10-26 NOTE — Telephone Encounter (Signed)
I sent it through [employer] but they denied it too.  Sounds like he will have to go back to traditional finger sticks twice daily (before breakfast and before bed). He has an appointment with Boyd Kerbs on the 24th of Jan which will hopefully help them to understand what foods to eat/avoid, etc which will be the most influential way to change his glucose without adding insulin.

## 2021-10-26 NOTE — Telephone Encounter (Signed)
Patient's mother called and still has not heard from anyone regarding his Dexcom. Please advise

## 2021-11-03 ENCOUNTER — Ambulatory Visit: Payer: Medicare Other | Admitting: Nutrition

## 2021-12-03 ENCOUNTER — Ambulatory Visit: Payer: Commercial Managed Care - HMO | Admitting: Nutrition

## 2021-12-14 LAB — BASIC METABOLIC PANEL
BUN: 20 (ref 4–21)
CO2: 26 — AB (ref 13–22)
Chloride: 104 (ref 99–108)
Creatinine: 0.6 (ref 0.6–1.3)
Glucose: 172
Potassium: 4.2 mEq/L (ref 3.5–5.1)
Sodium: 141 (ref 137–147)

## 2021-12-14 LAB — HEMOGLOBIN A1C: Hemoglobin A1C: 10

## 2021-12-14 LAB — COMPREHENSIVE METABOLIC PANEL: Calcium: 9.3 (ref 8.7–10.7)

## 2021-12-16 ENCOUNTER — Telehealth: Payer: Self-pay | Admitting: Nurse Practitioner

## 2021-12-16 NOTE — Telephone Encounter (Signed)
Patient;s mother called and said on Monday he had his A1C checked for Dr Sudie Bailey , which was 10. Dr Sudie Bailey wants to increase him to double his metformin extended release and his fasting blood sugar was 173. I asked her to please get them to send that result to [place]. She said she will and she wants you to make the decision on what he should do ?

## 2021-12-16 NOTE — Telephone Encounter (Signed)
Patients mother called back and I gave her the message. Mother is worried about the weight gain per Dr. Sudie Bailey. She stated that she will do what Whitney said to do and if she notices anything significant she will call back and if not they will see Boyd Kerbs on 12/29/2021 and Whitney on 01/14/2022. ?

## 2021-12-16 NOTE — Telephone Encounter (Signed)
Sounds good!  We can always look at using at incretin therapy in the future (just not sure how he will do with injectables).

## 2021-12-16 NOTE — Telephone Encounter (Signed)
Called Lupita Leash and left a detailed voice message for her to call back to discuss changes in medication for patient. ?

## 2021-12-16 NOTE — Telephone Encounter (Signed)
I would prefer not to increase his Metformin any more than it is.  As long as his readings are stable (or he has higher afternoon readings), we can try increasing his Glipizide to 10 mg XL daily with breakfast (can start taking 2 tabs in the morning with breakfast).

## 2021-12-20 ENCOUNTER — Encounter (HOSPITAL_COMMUNITY): Payer: Self-pay | Admitting: Emergency Medicine

## 2021-12-20 ENCOUNTER — Other Ambulatory Visit: Payer: Self-pay

## 2021-12-20 ENCOUNTER — Emergency Department (HOSPITAL_COMMUNITY)
Admission: EM | Admit: 2021-12-20 | Discharge: 2021-12-21 | Disposition: A | Discharge status: Home / Self Care | Payer: Medicare Other | Attending: Emergency Medicine | Admitting: Emergency Medicine

## 2021-12-20 DIAGNOSIS — K047 Periapical abscess without sinus: Secondary | ICD-10-CM | POA: Diagnosis not present

## 2021-12-20 DIAGNOSIS — K0889 Other specified disorders of teeth and supporting structures: Secondary | ICD-10-CM | POA: Diagnosis present

## 2021-12-20 DIAGNOSIS — Z7984 Long term (current) use of oral hypoglycemic drugs: Secondary | ICD-10-CM | POA: Insufficient documentation

## 2021-12-20 MED ORDER — CLINDAMYCIN HCL 300 MG PO CAPS: 300.0000 mg | ORAL_CAPSULE | Freq: Three times a day (TID) | ORAL | 0 refills | Status: AC

## 2021-12-20 MED ORDER — CLINDAMYCIN HCL 150 MG PO CAPS
300.0000 mg | ORAL_CAPSULE | Freq: Once | ORAL | Status: AC
Start: 2021-12-21 — End: 2021-12-21
  Administered 2021-12-21: 300 mg via ORAL
  Filled 2021-12-20: qty 2

## 2021-12-20 NOTE — ED Triage Notes (Signed)
Pt to the ED with dental swelling on the left side of his face that began yesterday. ? ?

## 2021-12-20 NOTE — ED Provider Notes (Signed)
? ?[facility]  ?Provider Note ? ?CSN: [id number] ?Arrival date & time: 12/20/21 1959 ? ?History ?Chief Complaint  ?Patient presents with  ? Oral Swelling  ? ? ?Gregory Kelley is a 26 y.o. male here with mother. Patient has had L lower 1st molar toothache recently. He was on Amoxil which helped some but pain and swelling has returned today. No fevers, no drainage, no difficulty swallowing or breathing. He has a Education officer, community, mother reports she is waiting on a referral to a different dentist to have that tooth extracted.  ? ? ?Home Medications ?Prior to Admission medications   ?Medication Sig Start Date End Date Taking? Authorizing Provider  ?clindamycin (CLEOCIN) 300 MG capsule Take 1 capsule (300 mg total) by mouth 3 (three) times daily for 10 days. 12/20/21 12/30/21 Yes Pollyann Savoy, MD  ?amphetamine-dextroamphetamine (ADDERALL) 30 MG tablet Take 1 tablet by mouth 2 (two) times daily. 10/14/20   [provider]  ?atorvastatin (LIPITOR) 20 MG tablet Take 20 mg by mouth daily. 09/15/21   [provider]  ?Clobetasol Propionate 0.05 % shampoo SMARTSIG:Sparingly Topical Daily 08/25/21   [provider]  ?Continuous Blood Gluc Sensor (DEXCOM G6 SENSOR) MISC Change sensor every 10 days as directed ?Patient not taking: Reported on 10/15/2021 09/30/21   Dani Gobble, NP  ?Continuous Blood Gluc Transmit (DEXCOM G6 TRANSMITTER) MISC Change transmitter every 90 days as directed. ?Patient not taking: Reported on 10/15/2021 09/30/21   Dani Gobble, NP  ?glipiZIDE (GLUCOTROL XL) 5 MG 24 hr tablet Take 1 tablet (5 mg total) by mouth daily with breakfast. 10/01/21   Dani Gobble, NP  ?ibuprofen (ADVIL) 800 MG tablet Take 800 mg by mouth every 6 (six) hours as needed for moderate pain or headache. 12/12/20   [provider]  ?metFORMIN (GLUCOPHAGE-XR) 500 MG 24 hr tablet Take 1,000 mg by mouth daily with breakfast. 09/08/21   [provider]  ?Multiple  Vitamin (MULTI-VITAMIN) tablet Take 1 tablet by mouth daily.    [provider]  ?VYVANSE 70 MG capsule Take 70 mg by mouth every morning. 01/29/21   [provider]  ? ? ? ?Allergies    ?Patient has no known allergies. ? ? ?Review of Systems   ?Review of Systems ?Please see HPI for pertinent positives and negatives ? ?Physical Exam ?BP 136/89 (BP Location: Right Arm)   Pulse (!) 116   Temp 98 ?F (36.7 ?C) (Oral)   Resp 18   Ht 5' 7.5" (1.715 m)   Wt 104.5 kg   SpO2 98%   BMI 35.55 kg/m?  ? ?Physical Exam ?Vitals and nursing note reviewed.  ?HENT:  ?   Head: Normocephalic.  ?   Nose: Nose normal.  ?   Mouth/Throat:  ?   Comments: Soft tissue swelling and tenderness adjacent to L lower molars where there is a large dental caries. No focal fluctuance ?Eyes:  ?   Extraocular Movements: Extraocular movements intact.  ?Pulmonary:  ?   Effort: Pulmonary effort is normal.  ?Musculoskeletal:     ?   General: Normal range of motion.  ?   Cervical back: Neck supple.  ?Skin: ?   Findings: No rash (on exposed skin).  ?Neurological:  ?   Mental Status: He is alert and oriented to person, place, and time.  ?Psychiatric:     ?   Mood and Affect: Mood normal.  ? ? ?ED Results / Procedures / Treatments   ?EKG ?None ? ?  Procedures ?Procedures ? ?Medications Ordered in the ED ?Medications  ?clindamycin (CLEOCIN) capsule 300 mg (has no administration in time range)  ? ? ?Initial Impression and Plan ? Patient with dental infection, no discernible abscess amenable to drainage. Given recent Rx for Amoxil, will switch to [place], recommend close outpatient Dental follow up.  ? ?ED Course  ? ?  ? ? ?MDM Rules/Calculators/A&P ?Medical Decision Making ?Problems Addressed: ?Dental infection: acute illness or injury ? ?Risk ?Prescription drug management. ? ? ? ?Final Clinical Impression(s) / ED Diagnoses ?Final diagnoses:  ?Dental infection  ? ? ?Rx / DC Orders ?ED Discharge Orders   ? ?      Ordered  ?  clindamycin  (CLEOCIN) 300 MG capsule  3 times daily       ? 12/20/21 2351  ? ?  ?  ? ?  ? ?  ?Pollyann Savoy, MD ?12/20/21 2351 ? ?

## 2021-12-21 DIAGNOSIS — K047 Periapical abscess without sinus: Secondary | ICD-10-CM | POA: Diagnosis not present

## 2021-12-29 ENCOUNTER — Ambulatory Visit: Payer: Medicare Other | Admitting: Nutrition

## 2022-01-06 ENCOUNTER — Ambulatory Visit: Payer: Medicare Other | Admitting: Nutrition

## 2022-01-06 NOTE — Patient Instructions (Incomplete)
Goals ? ? ? ? ? ?Diabetes Mellitus and Nutrition, Adult ?When you have diabetes, or diabetes mellitus, it is very important to have healthy eating habits because your blood sugar (glucose) levels are greatly affected by what you eat and drink. Eating healthy foods in the right amounts, at about the same times every day, can help you: ?Manage your blood glucose. ?Lower your risk of heart disease. ?Improve your blood pressure. ?Reach or maintain a healthy weight. ?What can affect my meal plan? ?Every person with diabetes is different, and each person has different needs for a meal plan. Your health care provider may recommend that you work with a dietitian to make a meal plan that is best for you. Your meal plan may vary depending on factors such as: ?The calories you need. ?The medicines you take. ?Your weight. ?Your blood glucose, blood pressure, and cholesterol levels. ?Your activity level. ?Other health conditions you have, such as heart or kidney disease. ?How do carbohydrates affect me? ?Carbohydrates, also called carbs, affect your blood glucose level more than any other type of food. Eating carbs raises the amount of glucose in your blood. ?It is important to know how many carbs you can safely have in each meal. This is different for every person. Your dietitian can help you calculate how many carbs you should have at each meal and for each snack. ?How does alcohol affect me? ?Alcohol can cause a decrease in blood glucose (hypoglycemia), especially if you use insulin or take certain diabetes medicines by mouth. Hypoglycemia can be a life-threatening condition. Symptoms of hypoglycemia, such as sleepiness, dizziness, and confusion, are similar to symptoms of having too much alcohol. ?Do not drink alcohol if: ?Your health care provider tells you not to drink. ?You are pregnant, may be pregnant, or are planning to become pregnant. ?If you drink alcohol: ?Limit how much you have to: ?0-1 drink a day for  women. ?0-2 drinks a day for men. ?Know how much alcohol is in your drink. In the [place] one drink equals one 12 oz bottle of beer (355 mL), one 5 oz glass of wine (148 mL), or one 1? oz glass of hard liquor (44 mL). ?Keep yourself hydrated with water, diet soda, or unsweetened iced tea. Keep in mind that regular soda, juice, and other mixers may contain a lot of sugar and must be counted as carbs. ?What are tips for following this plan? ?Reading food labels ?Start by checking the serving size on the Nutrition Facts label of packaged foods and drinks. The number of calories and the amount of carbs, fats, and other nutrients listed on the label are based on one serving of the item. Many items contain more than one serving per package. ?Check the total grams (g) of carbs in one serving. ?Check the number of grams of saturated fats and trans fats in one serving. Choose foods that have a low amount or none of these fats. ?Check the number of milligrams (mg) of salt (sodium) in one serving. Most people should limit total sodium intake to less than 2,300 mg per day. ?Always check the nutrition information of foods labeled as "low-fat" or "nonfat." These foods may be higher in added sugar or refined carbs and should be avoided. ?Talk to your dietitian to identify your daily goals for nutrients listed on the label. ?Shopping ?Avoid buying canned, pre-made, or processed foods. These foods tend to be high in fat, sodium, and added sugar. ?Shop around the outside edge of the grocery  store. This is where you will most often find fresh fruits and vegetables, bulk grains, fresh meats, and fresh dairy products. ?Cooking ?Use low-heat cooking methods, such as baking, instead of high-heat cooking methods, such as deep frying. ?Cook using healthy oils, such as olive, canola, or sunflower oil. ?Avoid cooking with butter, cream, or high-fat meats. ?Meal planning ?Eat meals and snacks regularly, preferably at the same times every day.  Avoid going long periods of time without eating. ?Eat foods that are high in fiber, such as fresh fruits, vegetables, beans, and whole grains. ?Eat 4-6 oz (112-168 g) of lean protein each day, such as lean meat, chicken, fish, eggs, or tofu. One ounce (oz) (28 g) of lean protein is equal to: ?1 oz (28 g) of meat, chicken, or fish. ?1 egg. ?? cup (62 g) of tofu. ?Eat some foods each day that contain healthy fats, such as avocado, nuts, seeds, and fish. ?What foods should I eat? ?Fruits ?Berries. Apples. Oranges. Peaches. Apricots. Plums. Grapes. Mangoes. Papayas. Pomegranates. Kiwi. Cherries. ?Vegetables ?Leafy greens, including lettuce, spinach, kale, chard, collard greens, mustard greens, and cabbage. Beets. Cauliflower. Broccoli. Carrots. Green beans. Tomatoes. Peppers. Onions. Cucumbers. Brussels sprouts. ?Grains ?Whole grains, such as whole-wheat or whole-grain bread, crackers, tortillas, cereal, and pasta. Unsweetened oatmeal. Quinoa. Brown or wild rice. ?Meats and other proteins ?Seafood. Poultry without skin. Lean cuts of poultry and beef. Tofu. Nuts. Seeds. ?Dairy ?Low-fat or fat-free dairy products such as milk, yogurt, and cheese. ?The items listed above may not be a complete list of foods and beverages you can eat and drink. Contact a dietitian for more information. ?What foods should I avoid? ?Fruits ?Fruits canned with syrup. ?Vegetables ?Canned vegetables. Frozen vegetables with butter or cream sauce. ?Grains ?Refined white flour and flour products such as bread, pasta, snack foods, and cereals. Avoid all processed foods. ?Meats and other proteins ?Fatty cuts of meat. Poultry with skin. Breaded or fried meats. Processed meat. Avoid saturated fats. ?Dairy ?Full-fat yogurt, cheese, or milk. ?Beverages ?Sweetened drinks, such as soda or iced tea. ?The items listed above may not be a complete list of foods and beverages you should avoid. Contact a dietitian for more information. ?Questions to ask a health  care provider ?Do I need to meet with a certified diabetes care and education specialist? ?Do I need to meet with a dietitian? ?What number can I call if I have questions? ?When are the best times to check my blood glucose? ?Where to find more information: ?American Diabetes Association: diabetes.org ?Academy of Nutrition and Dietetics: eatright.org ?[employer] of Diabetes and Digestive and Kidney Diseases: AmenCredit.is ?Association of Diabetes Care & Education Specialists: diabeteseducator.org ?Summary ?It is important to have healthy eating habits because your blood sugar (glucose) levels are greatly affected by what you eat and drink. It is important to use alcohol carefully. ?A healthy meal plan will help you manage your blood glucose and lower your risk of heart disease. ?Your health care provider may recommend that you work with a dietitian to make a meal plan that is best for you. ?This information is not intended to replace advice given to you by your health care provider. Make sure you discuss any questions you have with your health care provider. ?Document Revised: 04/30/2020 Document Reviewed: 04/30/2020 ?Elsevier Patient Education ? [place]. ? ?

## 2022-01-13 ENCOUNTER — Ambulatory Visit: Payer: Commercial Managed Care - HMO | Admitting: Nurse Practitioner

## 2022-01-14 ENCOUNTER — Ambulatory Visit (INDEPENDENT_AMBULATORY_CARE_PROVIDER_SITE_OTHER): Payer: Medicare Other | Admitting: Nurse Practitioner

## 2022-01-14 ENCOUNTER — Encounter: Payer: Self-pay | Admitting: Nurse Practitioner

## 2022-01-14 VITALS — BP 113/73 | HR 82 | Ht 67.5 in | Wt 243.4 lb

## 2022-01-14 DIAGNOSIS — E1165 Type 2 diabetes mellitus with hyperglycemia: Secondary | ICD-10-CM

## 2022-01-14 DIAGNOSIS — E782 Mixed hyperlipidemia: Secondary | ICD-10-CM

## 2022-01-14 MED ORDER — OZEMPIC (0.25 OR 0.5 MG/DOSE) 2 MG/1.5ML ~~LOC~~ SOPN: 0.5000 mg | PEN_INJECTOR | SUBCUTANEOUS | 3 refills | Status: DC

## 2022-01-14 MED ORDER — FREESTYLE LIBRE 3 SENSOR MISC: 3 refills | Status: DC

## 2022-01-14 NOTE — Patient Instructions (Signed)

## 2022-01-14 NOTE — Progress Notes (Signed)
? ?                                                    Endocrinology Follow Up Note  ?     01/14/2022, 4:01 PM ? ? ?Subjective:  ? ? Patient ID: Gregory Kelley, male    DOB: [birth year].  ?Gregory Kelley is being seen in follow up after being seen in consultation for management of currently uncontrolled symptomatic diabetes requested by  Gareth Morgan, MD.  He is accompanied today by his adopted mother due to his intellectual disability. ? ? ?Past Medical History:  ?Diagnosis Date  ? Autism   ? Diabetes mellitus   ? Fetal alcohol syndrome   ? ? ?Past Surgical History:  ?Procedure Laterality Date  ? HERNIA REPAIR    ? ? ?Social History  ? ?Socioeconomic History  ? Marital status: Single  ?  Spouse name: Not on file  ? Number of children: Not on file  ? Years of education: Not on file  ? Highest education level: Not on file  ?Occupational History  ? Not on file  ?Tobacco Use  ? Smoking status: Never  ? Smokeless tobacco: Never  ?Vaping Use  ? Vaping Use: Some days  ?Substance and Sexual Activity  ? Alcohol use: Yes  ?  Comment: occasionally   ? Drug use: No  ? Sexual activity: Not on file  ?Other Topics Concern  ? Not on file  ?Social History Narrative  ? Not on file  ? ?Social Determinants of Health  ? ?Financial Resource Strain: Not on file  ?Food Insecurity: Not on file  ?Transportation Needs: Not on file  ?Physical Activity: Not on file  ?Stress: Not on file  ?Social Connections: Not on file  ? ? ?Family History  ?Adopted: Yes  ? ? ?Current Outpatient Medications on File Prior to Visit  ?Medication Sig Dispense Refill  ? amphetamine-dextroamphetamine (ADDERALL) 30 MG tablet Take 1 tablet by mouth 2 (two) times daily.    ? atorvastatin (LIPITOR) 20 MG tablet Take 20 mg by mouth daily.    ? Clobetasol Propionate 0.05 % shampoo SMARTSIG:Sparingly Topical Daily    ? glipiZIDE (GLUCOTROL XL) 5 MG 24 hr tablet Take 1 tablet (5 mg total) by mouth daily with breakfast. (Patient taking  differently: Take 10 mg by mouth daily with breakfast.) 90 tablet 3  ? ibuprofen (ADVIL) 800 MG tablet Take 800 mg by mouth every 6 (six) hours as needed for moderate pain or headache.    ? metFORMIN (GLUCOPHAGE-XR) 500 MG 24 hr tablet Take 1,000 mg by mouth daily with breakfast.    ? Multiple Vitamin (MULTI-VITAMIN) tablet Take 1 tablet by mouth daily.    ? VYVANSE 70 MG capsule Take 70 mg by mouth every morning.    ? ?No current facility-administered medications on file prior to visit.  ? ? ? ? ?ALLERGIES: ?No Known Allergies ? ?VACCINATION STATUS: ? ?There is no immunization history on file for this patient. ? ?Diabetes ?He presents for his follow-up diabetic visit. He has type 2 diabetes mellitus. Onset time: newly diagnosed at age of 26. His disease course has been improving. There are no hypoglycemic associated symptoms. 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 Pertinent negatives for diabetes include no weight loss. There are no  hypoglycemic complications. Symptoms are stable. There are no diabetic complications. Risk factors for coronary artery disease include diabetes mellitus, dyslipidemia, family history, male sex, obesity and sedentary lifestyle. Current diabetic treatment includes oral agent (dual therapy). He is compliant with treatment most of the time. His weight is increasing steadily. He is following a generally unhealthy diet. When asked about meal planning, he reported none. He has not had a previous visit with a dietitian. He rarely participates in exercise. His breakfast blood glucose range is generally 140-180 mg/dl. His bedtime blood glucose range is generally >200 mg/dl. His overall blood glucose range is >200 mg/dl. (He presents today, accompanied by his mother, with his logs showing inconsistent glucose monitoring pattern.  He was unfortunately denied from getting a CGM due to him not being on insulin.  His most recent A1c was 10%  on 12/14/21, improving from last visit of 14%.  His mom says they are working on diet, have missed several appointments with RDE due to moms migraines.  ) An ACE inhibitor/angiotensin II receptor blocker is not being taken. He does not see a podiatrist.Eye exam is current.  ?Hyperlipidemia ?This is a chronic problem. The current episode started more than 1 year ago. The problem is uncontrolled. Recent lipid tests were reviewed and are high. Exacerbating diseases include diabetes and obesity. Factors aggravating his hyperlipidemia include fatty foods. Current antihyperlipidemic treatment includes statins. Compliance problems include adherence to diet, adherence to exercise and psychosocial issues.  Risk factors for coronary artery disease include diabetes mellitus, dyslipidemia, family history, male sex, obesity and a sedentary lifestyle.  ? ? ?Review of systems ? ?Constitutional: + steadily increasing body weight, current Body mass index is 37.56 kg/m?., + fatigue, no subjective hyperthermia, no subjective hypothermia ?Eyes: no blurry vision, no xerophthalmia, blind in R eye ?ENT: no sore throat, no nodules palpated in throat, no dysphagia/odynophagia, no hoarseness ?Cardiovascular: no chest pain, no shortness of breath, no palpitations, no leg swelling ?Respiratory: no cough, no shortness of breath ?Gastrointestinal: no nausea/vomiting/diarrhea ?Musculoskeletal: no muscle/joint aches ?Skin: no rashes, no hyperemia ?Neurological: no tremors, no numbness, no tingling, no dizziness ?Psychiatric: no depression, no anxiety, hx of intellectual disability (autistic) ? ?Objective:  ?  ? ?BP 113/73   Pulse 82   Ht 5' 7.5" (1.715 m)   Wt 243 lb 6.4 oz (110.4 kg)   SpO2 98%   BMI 37.56 kg/m?   ?Wt Readings from Last 3 Encounters:  ?01/14/22 243 lb 6.4 oz (110.4 kg)  ?12/20/21 230 lb 6.1 oz (104.5 kg)  ?10/15/21 230 lb 6.4 oz (104.5 kg)  ?  ? ?BP Readings from Last 3 Encounters:  ?01/14/22 113/73  ?12/21/21 128/87   ?10/15/21 119/73  ?  ? ? ?Physical Exam- Limited ? ?Constitutional:  Body mass index is 37.56 kg/m?. , not in acute distress, normal state of mind ?Eyes:  EOMI, no exophthalmos ?Neck: Supple ?Cardiovascular: RRR, no murmurs, rubs, or gallops, no edema ?Respiratory: Adequate breathing efforts, no crackles, rales, rhonchi, or wheezing ?Musculoskeletal: no gross deformities, strength intact in all four extremities, no gross restriction of joint movements ?Skin:  no rashes, no hyperemia ?Neurological: no tremor with outstretched hands ? ? ?CMP ( most recent) ?CMP  ?   ?Component Value Date/Time  ? NA 141 12/14/2021 0000  ? K 4.2 12/14/2021 0000  ? CL 104 12/14/2021 0000  ? CO2 26 (A) 12/14/2021 0000  ? GLUCOSE 133 (H) 11/16/2020 5427  ? BUN 20 12/14/2021 0000  ? CREATININE 0.6 12/14/2021 0000  ?  CREATININE 0.71 11/16/2020 0312  ? CALCIUM 9.3 12/14/2021 0000  ? PROT 7.5 11/16/2020 0312  ? ALBUMIN 4.1 11/16/2020 0312  ? AST 42 (H) 11/16/2020 [id number]  ? ALT 111 (H) 11/16/2020 [id number]  ? ALKPHOS 88 11/16/2020 0312  ? BILITOT 0.9 11/16/2020 0312  ? GFRNONAA 126 09/07/2021 0000  ? GFRNONAA >60 11/16/2020 [id number]  ? ? ? ?Diabetic Labs (most recent): ?Lab Results  ?Component Value Date  ? HGBA1C 10 12/14/2021  ? HGBA1C 14 09/07/2021  ? ? ? Lipid Panel ( most recent) ?Lipid Panel  ?   ?Component Value Date/Time  ? TRIG 224 (A) 09/07/2021 0000  ? LDLCALC 100 09/07/2021 0000  ? ?  ? ?No results found for: TSH, FREET4  ? ?   ? ? ? ?Assessment & Plan:  ? ?1) Type 2 diabetes mellitus with hyperglycemia, without long-term current use of insulin (HCC) ? ?He presents today, accompanied by his mother, with his logs showing inconsistent glucose monitoring pattern.  He was unfortunately denied from getting a CGM due to him not being on insulin.  His most recent A1c was 10% on 12/14/21, improving from last visit of 14%.  His mom says they are working on diet, have missed several appointments with RDE due to moms migraines.   ? ?- Gregory Kelley  has currently uncontrolled symptomatic type 2 DM since 26 years of age.  ? ?-Recent labs reviewed. ? ?- I had a long discussion with him about the progressive nature of diabetes and the pathology behind its complications. ?-h

## 2022-01-15 ENCOUNTER — Other Ambulatory Visit: Payer: Self-pay

## 2022-01-15 ENCOUNTER — Telehealth: Payer: Self-pay

## 2022-01-15 MED ORDER — FREESTYLE LIBRE 2 SENSOR MISC: 2 refills | Status: DC

## 2022-01-15 NOTE — Telephone Encounter (Signed)
I called [facility] and spoke to Judson Roch and she stated that the [employer] 3 is not covered but the [employer] 2 and [employer] 14 day are covered. I sent in a prescription for the [facility] 2 Sensors. ?

## 2022-01-18 ENCOUNTER — Telehealth: Payer: Self-pay

## 2022-01-18 NOTE — Telephone Encounter (Signed)
Started prior authorization for [employer]. ?Key # [id number] ?

## 2022-01-21 NOTE — Telephone Encounter (Signed)
Received a Denial for the [facility] [place] 2 sensors. ?

## 2022-03-26 LAB — HEMOGLOBIN A1C: Hemoglobin A1C: 7.7

## 2022-04-15 ENCOUNTER — Ambulatory Visit: Payer: Medicare Other | Admitting: Nurse Practitioner

## 2022-04-27 ENCOUNTER — Other Ambulatory Visit: Payer: Self-pay | Admitting: Nurse Practitioner

## 2022-05-04 ENCOUNTER — Encounter: Payer: Self-pay | Admitting: Nurse Practitioner

## 2022-05-04 ENCOUNTER — Ambulatory Visit (INDEPENDENT_AMBULATORY_CARE_PROVIDER_SITE_OTHER): Payer: Medicare Other | Admitting: Nurse Practitioner

## 2022-05-04 VITALS — BP 115/80 | HR 92 | Ht 67.5 in | Wt 249.0 lb

## 2022-05-04 DIAGNOSIS — E1165 Type 2 diabetes mellitus with hyperglycemia: Secondary | ICD-10-CM

## 2022-05-04 DIAGNOSIS — E782 Mixed hyperlipidemia: Secondary | ICD-10-CM | POA: Diagnosis not present

## 2022-05-04 MED ORDER — SEMAGLUTIDE (1 MG/DOSE) 4 MG/3ML ~~LOC~~ SOPN: 1.0000 mg | PEN_INJECTOR | SUBCUTANEOUS | 3 refills | Status: DC

## 2022-05-04 MED ORDER — DEXCOM G6 SENSOR MISC: 3 refills | Status: DC

## 2022-05-04 MED ORDER — DEXCOM G6 TRANSMITTER MISC: 3 refills | Status: DC

## 2022-05-04 NOTE — Progress Notes (Signed)
Endocrinology Follow Up Note       05/04/2022, 3:42 PM   Subjective:    Patient ID: Gregory Kelley, male    DOB: 06-May-1996.  Gregory Kelley is being seen in follow up after being seen in consultation for management of currently uncontrolled symptomatic diabetes requested by  Gareth Morgan, MD.  He is accompanied today by his adopted mother due to his intellectual disability.   Past Medical History:  Diagnosis Date   Autism    Diabetes mellitus    Fetal alcohol syndrome     Past Surgical History:  Procedure Laterality Date   HERNIA REPAIR      Social History   Socioeconomic History   Marital status: Single    Spouse name: Not on file   Number of children: Not on file   Years of education: Not on file   Highest education level: Not on file  Occupational History   Not on file  Tobacco Use   Smoking status: Never   Smokeless tobacco: Never  Vaping Use   Vaping Use: Some days  Substance and Sexual Activity   Alcohol use: Yes    Comment: occasionally    Drug use: No   Sexual activity: Not on file  Other Topics Concern   Not on file  Social History Narrative   Not on file   Social Determinants of Health   Financial Resource Strain: Not on file  Food Insecurity: Not on file  Transportation Needs: Not on file  Physical Activity: Not on file  Stress: Not on file  Social Connections: Not on file    Family History  Adopted: Yes    Current Outpatient Medications on File Prior to Visit  Medication Sig Dispense Refill   amphetamine-dextroamphetamine (ADDERALL) 30 MG tablet Take 1 tablet by mouth 2 (two) times daily.     atorvastatin (LIPITOR) 20 MG tablet Take 20 mg by mouth daily.     Clobetasol Propionate 0.05 % shampoo SMARTSIG:Sparingly Topical Daily     ibuprofen (ADVIL) 800 MG tablet Take 800 mg by mouth every 6 (six) hours as needed for moderate pain or headache.      metFORMIN (GLUCOPHAGE-XR) 500 MG 24 hr tablet Take 1,000 mg by mouth daily with breakfast.     Multiple Vitamin (MULTI-VITAMIN) tablet Take 1 tablet by mouth daily.     VYVANSE 70 MG capsule Take 70 mg by mouth every morning.     No current facility-administered medications on file prior to visit.      ALLERGIES: No Known Allergies  VACCINATION STATUS:  There is no immunization history on file for this patient.  Diabetes He presents for his follow-up diabetic visit. He has type 2 diabetes mellitus. Onset time: newly diagnosed at age of 84. His disease course has been improving. There are no hypoglycemic associated symptoms. 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 Pertinent negatives for diabetes include no weight loss. There are no hypoglycemic complications. Symptoms are stable. There are no diabetic complications. Risk factors for coronary artery disease include diabetes mellitus, dyslipidemia, family history, male sex, obesity and sedentary lifestyle. Current diabetic treatment includes oral agent (dual  therapy). He is compliant with treatment most of the time. His weight is fluctuating minimally. He is following a generally unhealthy diet. When asked about meal planning, he reported none. He has not had a previous visit with a dietitian. He rarely participates in exercise. (He presents today, accompanied by his mother, with no meter or logs, says he does not have a meter (his old one broke).  His previsit A1c, done at his PCP on 6/16 was 7.7%, improving from last visit of 10%.  He admits he does not like to take the pills, therefore there are some days he skips them altogether.  He has tolerated the Ozempic well.  Prior to his meter breaking, he did note he had a low reading of 49 one morning, likely due to timing of meals?) An ACE inhibitor/angiotensin II receptor blocker is not being taken. He does not see a podiatrist.Eye exam is  current.  Hyperlipidemia This is a chronic problem. The current episode started more than 1 year ago. The problem is uncontrolled. Recent lipid tests were reviewed and are high. Exacerbating diseases include diabetes and obesity. Factors aggravating his hyperlipidemia include fatty foods. Current antihyperlipidemic treatment includes statins. Compliance problems include adherence to diet, adherence to exercise and psychosocial issues.  Risk factors for coronary artery disease include diabetes mellitus, dyslipidemia, family history, male sex, obesity and a sedentary lifestyle.     Review of systems  Constitutional: + steadily increasing body weight, current Body mass index is 38.42 kg/m., + fatigue, no subjective hyperthermia, no subjective hypothermia Eyes: no blurry vision, no xerophthalmia, blind in R eye ENT: no sore throat, no nodules palpated in throat, no dysphagia/odynophagia, no hoarseness Cardiovascular: no chest pain, no shortness of breath, no palpitations, no leg swelling Respiratory: no cough, no shortness of breath Gastrointestinal: no nausea/vomiting/diarrhea Musculoskeletal: no muscle/joint aches Skin: no rashes, no hyperemia Neurological: no tremors, no numbness, no tingling, no dizziness Psychiatric: no depression, no anxiety, hx of intellectual disability (autistic)  Objective:     BP 115/80   Pulse 92   Ht 5' 7.5" (1.715 m)   Wt 249 lb (112.9 kg)   BMI 38.42 kg/m   Wt Readings from Last 3 Encounters:  05/04/22 249 lb (112.9 kg)  01/14/22 243 lb 6.4 oz (110.4 kg)  12/20/21 230 lb 6.1 oz (104.5 kg)     BP Readings from Last 3 Encounters:  05/04/22 115/80  01/14/22 113/73  12/21/21 128/87      Physical Exam- Limited  Constitutional:  Body mass index is 38.42 kg/m. , not in acute distress, normal state of mind Eyes:  EOMI, no exophthalmos Neck: Supple Cardiovascular: RRR, no murmurs, rubs, or gallops, no edema Respiratory: Adequate breathing efforts,  no crackles, rales, rhonchi, or wheezing Musculoskeletal: no gross deformities, strength intact in all four extremities, no gross restriction of joint movements Skin:  no rashes, no hyperemia Neurological: no tremor with outstretched hands   CMP ( most recent) CMP     Component Value Date/Time   NA 141 12/14/2021 0000   K 4.2 12/14/2021 0000   CL 104 12/14/2021 0000   CO2 26 (A) 12/14/2021 0000   GLUCOSE 133 (H) 11/16/2020 0312   BUN 20 12/14/2021 0000   CREATININE 0.6 12/14/2021 0000   CREATININE 0.71 11/16/2020 0312   CALCIUM 9.3 12/14/2021 0000   PROT 7.5 11/16/2020 0312   ALBUMIN 4.1 11/16/2020 0312   AST 42 (H) 11/16/2020 0312   ALT 111 (H) 11/16/2020 0312   ALKPHOS 88 11/16/2020  5956   BILITOT 0.9 11/16/2020 0312   GFRNONAA 126 09/07/2021 0000   GFRNONAA >60 11/16/2020 0312     Diabetic Labs (most recent): Lab Results  Component Value Date   HGBA1C 7.7 03/26/2022   HGBA1C 10 12/14/2021   HGBA1C 14 09/07/2021     Lipid Panel ( most recent) Lipid Panel     Component Value Date/Time   TRIG 224 (A) 09/07/2021 0000   LDLCALC 100 09/07/2021 0000      No results found for: "TSH", "FREET4"         Assessment & Plan:   1) Type 2 diabetes mellitus with hyperglycemia, without long-term current use of insulin (HCC)  He presents today, accompanied by his mother, with no meter or logs, says he does not have a meter (his old one broke).  His previsit A1c, done at his PCP on 6/16 was 7.7%, improving from last visit of 10%.  He admits he does not like to take the pills, therefore there are some days he skips them altogether.  He has tolerated the Ozempic well.  Prior to his meter breaking, he did note he had a low reading of 49 one morning, likely due to timing of meals?    - Gregory Kelley has currently uncontrolled symptomatic type 2 DM since 26 years of age.   -Recent labs reviewed.  - I had a long discussion with him about the progressive nature of  diabetes and the pathology behind its complications. -his diabetes is not currently complicated but he remains at a high risk for more acute and chronic complications which include CAD, CVA, CKD, retinopathy, and neuropathy. These are all discussed in detail with him.  - Nutritional counseling repeated at each appointment due to patients tendency to fall back in to old habits.  - The patient admits there is a room for improvement in their diet and drink choices. -  Suggestion is made for the patient to avoid simple carbohydrates from their diet including Cakes, Sweet Desserts / Pastries, Ice Cream, Soda (diet and regular), Sweet Tea, Candies, Chips, Cookies, Sweet Pastries, Store Bought Juices, Alcohol in Excess of 1-2 drinks a day, Artificial Sweeteners, Coffee Creamer, and "Sugar-free" Products. This will help patient to have stable blood glucose profile and potentially avoid unintended weight gain.   - I encouraged the patient to switch to unprocessed or minimally processed complex starch and increased protein intake (animal or plant source), fruits, and vegetables.   - Patient is advised to stick to a routine mealtimes to eat 3 meals a day and avoid unnecessary snacks (to snack only to correct hypoglycemia).  - he will be scheduled with Norm Salt, RDN, CDE for diabetes education. He has missed several appointments with her due to his moms migraines.  - I have approached him with the following individualized plan to manage his diabetes and patient agrees:   -He is advised to continue Metformin 500 mg ER twice daily with meals.  I increased his Ozempic to 1 mg SQ weekly (since he is tolerating it well and does not mind injections) and will stop his Glipizide today, given his account of hypoglycemia.   -he is encouraged to start monitoring glucose at least twice daily, before breakfast and before bed, and to call the clinic if he has readings less than 70 or above 300 for 3 tests in a row.    He is an excellent candidate for CGM device such as Dexcom given his hypoglycemic unawareness and intellectual  disability so that his mother can have access to his glucose readings when they are away from each other and now that he has had a documented low glucose, perhaps we have a chance of getting it covered for him.  - Adjustment parameters are given to him for hypo and hyperglycemia in writing.  - his [place] Pepper was discontinued, risk outweighs benefit for this patient.  - he will be considered for incretin therapy as appropriate next visit, although should be done so cautiously given his high triglycerides increasing his risk of pancreatitis.  - Specific targets for  A1c; LDL, HDL, and Triglycerides were discussed with the patient.  2) Blood Pressure /Hypertension:  his blood pressure is controlled to target without the use of antihypertensive medications.     3) Lipids/Hyperlipidemia:    Review of his recent lipid panel from 09/07/21 showed uncontrolled LDL at 100 and elevated triglycerides of 224 .  he is advised to continue Atorvastatin 20 mg daily at bedtime.  Side effects and precautions discussed with him.  4)  Weight/Diet:  his Body mass index is 38.42 kg/m.  -  clearly complicating his diabetes care.   he is a candidate for weight loss. I discussed with him the fact that loss of 5 - 10% of his  current body weight will have the most impact on his diabetes management.  Exercise, and detailed carbohydrates information provided  -  detailed on discharge instructions.  5) Chronic Care/Health Maintenance: -he is not on ACEI/ARB and was recently started on Statin medications and is encouraged to initiate and continue to follow up with Ophthalmology, Dentist, Podiatrist at least yearly or according to recommendations, and advised to stay away from smoking. I have recommended yearly flu vaccine and pneumonia vaccine at least every 5 years; moderate intensity exercise for up to 150 minutes  weekly; and sleep for at least 7 hours a day.  - he is advised to maintain close follow up with Gareth Morgan, MD for primary care needs, as well as his other providers for optimal and coordinated care.       I spent 30 minutes in the care of the patient today including review of labs from CMP, Lipids, Thyroid Function, Hematology (current and previous including abstractions from other facilities); face-to-face time discussing  his blood glucose readings/logs, discussing hypoglycemia and hyperglycemia episodes and symptoms, medications doses, his options of short and long term treatment based on the latest standards of care / guidelines;  discussion about incorporating lifestyle medicine;  and documenting the encounter. Risk reduction counseling performed per USPSTF guidelines to reduce obesity and cardiovascular risk factors.     Please refer to Patient Instructions for Blood Glucose Monitoring and Insulin/Medications Dosing Guide"  in media tab for additional information. Please  also refer to " Patient Self Inventory" in the Media  tab for reviewed elements of pertinent patient history.  Gregory Kelley participated in the discussions, expressed understanding, and voiced agreement with the above plans.  All questions were answered to his satisfaction. he is encouraged to contact clinic should he have any questions or concerns prior to his return visit.     Follow up plan: - Return in about 3 months (around 08/04/2022) for Diabetes F/U with A1c in office, No previsit labs, Bring meter and logs.  Ronny Bacon, [facility] [address] [place], [place] [address] Phone: [phone] Fax: [phone]  05/04/2022, 3:42 PM

## 2022-06-29 ENCOUNTER — Telehealth: Payer: Self-pay | Admitting: Nurse Practitioner

## 2022-06-29 MED ORDER — METFORMIN HCL ER 500 MG PO TB24: 1000.0000 mg | ORAL_TABLET | Freq: Every day | ORAL | 3 refills | Status: DC

## 2022-06-29 NOTE — Telephone Encounter (Signed)
done

## 2022-06-29 NOTE — Telephone Encounter (Signed)
Pt's mom called to get a refill on his Metformin, would you send this in to [facility]

## 2022-08-05 ENCOUNTER — Ambulatory Visit: Payer: Medicare Other | Admitting: Nurse Practitioner

## 2022-08-11 ENCOUNTER — Other Ambulatory Visit: Payer: Self-pay | Admitting: Nurse Practitioner

## 2022-08-17 ENCOUNTER — Ambulatory Visit (INDEPENDENT_AMBULATORY_CARE_PROVIDER_SITE_OTHER): Payer: Medicare Other | Admitting: Nurse Practitioner

## 2022-08-17 ENCOUNTER — Encounter: Payer: Self-pay | Admitting: Nurse Practitioner

## 2022-08-17 VITALS — BP 109/72 | HR 98 | Ht 67.5 in | Wt 242.0 lb

## 2022-08-17 DIAGNOSIS — E782 Mixed hyperlipidemia: Secondary | ICD-10-CM

## 2022-08-17 DIAGNOSIS — E1165 Type 2 diabetes mellitus with hyperglycemia: Secondary | ICD-10-CM

## 2022-08-17 LAB — POCT GLYCOSYLATED HEMOGLOBIN (HGB A1C): Hemoglobin A1C: 6.1 % — AB (ref 4.0–5.6)

## 2022-08-17 MED ORDER — METFORMIN HCL ER 500 MG PO TB24: 1000.0000 mg | ORAL_TABLET | Freq: Every day | ORAL | 3 refills | Status: DC

## 2022-08-17 MED ORDER — OZEMPIC (1 MG/DOSE) 4 MG/3ML ~~LOC~~ SOPN: 1.0000 mg | PEN_INJECTOR | SUBCUTANEOUS | 3 refills | Status: DC

## 2022-08-17 NOTE — Patient Instructions (Signed)
Living With Diabetes Diabetes (type 1 diabetes mellitus or type 2 diabetes mellitus) is a condition in which the body does not make enough of a hormone called insulin or does not respond the right way to insulin. The job of insulin is to move sugars (glucose) into cells in the body. In people with diabetes, extra glucose builds up in the blood instead of going into cells. This results in high blood glucose (hyperglycemia). How to manage lifestyle changes To help manage your diabetes, you may need treatment, such as medicines. You may also need to make lifestyle changes. To take care of yourself, you should: Monitor your glucose often. Eat a healthy diet. Exercise often. Meet with your health care providers. Take medicines as told by your health care providers. Most people feel some stress when it comes to managing their diabetes. When this stress becomes too much, it is known as diabetes distress. This is very common. Living with diabetes can also place you at risk for depression and anxiety. These disorders can make your condition harder to manage. How to recognize stress You may have diabetes distress if: You avoid or ignore your daily diabetes care. Daily care may include testing your glucose, following a meal plan, and taking medicines. You feel overwhelmed by what you have to do each day for your care. You feel anger, sadness, or fear when it comes to your daily care. You feel fear or shame about not being perfect at doing the things you have been told to do. Emotional distress If you have diabetes distress, you may feel: Anger about having diabetes. Fear or frustration about your condition and the changes you need to make to manage it. A lot of worry about the care that you need or the cost of the care that you need. Like you caused your condition by doing something wrong. Fear about changes in your blood glucose that you may not be able to predict. Judged by your health care  providers. Very alone. Depression Having diabetes means that you are more at risk for depression. Your health care provider may test (screen) you for symptoms. Symptoms may include: Loss of interest in things that you used to enjoy. Feeling depressed much or most of the time. A change in appetite. Trouble getting to sleep or staying asleep. Feeling tired most of the day. Feeling nervous and anxious. Feeling guilty and worried that you are a burden to others. Having thoughts of hurting yourself or feeling that you want to die. If you have any of these symptoms on more days than not and for 2 weeks or longer, you may have depression. This would be a good time to contact your health care provider. How to manage diabetes distress To manage distress: Learn as much as you can about your condition and its treatment. Take one step at a time to improve the way you manage your daily care. Meet with an expert trained in diabetes care (certified diabetes educator). Take a class to learn how to manage your condition. Consider working with a counselor or therapist. Keep a journal of your thoughts and concerns. Accept that some things are out of your control. Talk with other people who have diabetes. It can help to talk about the distress that you feel. Find ways to manage stress that work for you. These may include listening to music, art, exercise, meditation, and other hobbies. Seek support from spiritual leaders, family, and friends. Follow these instructions at home: Do your best to follow  your plan for how to manage your diabetes. If you are struggling to follow your plan, talk with a diabetes educator or someone else who has diabetes. They may have ideas that will help. Forgive yourself for not being perfect. Almost everyone struggles with the tasks of diabetes. Keep all follow-up visits. Your health care provider will want to monitor your glucose levels. You can also discuss any concerns you have  at these visits. Where to find support Find support from the American Diabetes Association (ADA): diabetes.org Find an expert to help you manage your condition. Make an appointment through the Association of Diabetes Care & Education Specialists (ADCES): diabeteseducator.org Contact a health care provider if: You believe your diabetes is getting out of control. You may be depressed. Your medicines are not helping control your diabetes. You feel overwhelmed. Get help right away if: You have thoughts about hurting yourself or others. Get help right away if you feel like you may hurt yourself or others, or have thoughts about taking your own life. Go to your nearest emergency room or: Call 911. Call the National Suicide Prevention Lifeline at (878)667-3022 or 988. This is open 24 hours a day. Text the Crisis Text Line at 519-403-6996. This information is not intended to replace advice given to you by your health care provider. Make sure you discuss any questions you have with your health care provider. Document Revised: 04/02/2022 Document Reviewed: 04/02/2022 Elsevier Patient Education  2023 [employer].

## 2022-08-17 NOTE — Progress Notes (Signed)
Endocrinology Follow Up Note       08/17/2022, 4:15 PM   Subjective:    Patient ID: Gregory Kelley, male    DOB: Aug 08, 1996.  Gregory PASKETT is being seen in follow up after being seen in consultation for management of currently uncontrolled symptomatic diabetes requested by  Gregory Morgan, MD.  He is accompanied today by his adopted mother due to his intellectual disability.   Past Medical History:  Diagnosis Date   Autism    Diabetes mellitus    Fetal alcohol syndrome     Past Surgical History:  Procedure Laterality Date   HERNIA REPAIR      Social History   Socioeconomic History   Marital status: Single    Spouse name: Not on file   Number of children: Not on file   Years of education: Not on file   Highest education level: Not on file  Occupational History   Not on file  Tobacco Use   Smoking status: Never   Smokeless tobacco: Never  Vaping Use   Vaping Use: Some days  Substance and Sexual Activity   Alcohol use: Yes    Comment: occasionally    Drug use: No   Sexual activity: Not on file  Other Topics Concern   Not on file  Social History Narrative   Not on file   Social Determinants of Health   Financial Resource Strain: Not on file  Food Insecurity: Not on file  Transportation Needs: Not on file  Physical Activity: Not on file  Stress: Not on file  Social Connections: Not on file    Family History  Adopted: Yes    Current Outpatient Medications on File Prior to Visit  Medication Sig Dispense Refill   amphetamine-dextroamphetamine (ADDERALL) 30 MG tablet Take 1 tablet by mouth 2 (two) times daily.     atorvastatin (LIPITOR) 20 MG tablet Take 20 mg by mouth daily.     Clobetasol Propionate 0.05 % shampoo SMARTSIG:Sparingly Topical Daily     Continuous Blood Gluc Sensor (DEXCOM G6 SENSOR) MISC Change sensor every 10 days as directed 9 each 3   Continuous Blood Gluc  Transmit (DEXCOM G6 TRANSMITTER) MISC Change transmitter every 90 days as directed. 1 each 3   ibuprofen (ADVIL) 800 MG tablet Take 800 mg by mouth every 6 (six) hours as needed for moderate pain or headache.     Multiple Vitamin (MULTI-VITAMIN) tablet Take 1 tablet by mouth daily.     VYVANSE 70 MG capsule Take 70 mg by mouth every morning.     No current facility-administered medications on file prior to visit.      ALLERGIES: No Known Allergies  VACCINATION STATUS:  There is no immunization history on file for this patient.  Diabetes He presents for his follow-up diabetic visit. He has type 2 diabetes mellitus. Onset time: newly diagnosed at age of 23. His disease course has been improving. There are no hypoglycemic associated symptoms. 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 There are no hypoglycemic complications. Symptoms are stable. There are no diabetic complications. Risk factors for coronary artery disease include diabetes mellitus,  dyslipidemia, family history, male sex, obesity and sedentary lifestyle. Current diabetic treatment includes oral agent (monotherapy) (and Ozempic). He is compliant with treatment most of the time. His weight is decreasing steadily. He is following a generally unhealthy diet. When asked about meal planning, he reported none. He has not had a previous visit with a dietitian. He rarely participates in exercise. His home blood glucose trend is decreasing steadily. (He presents today, accompanied by his mom, with his CGM showing at goal glycemic profile.  His POCT A1c today is 6.1%, improving from last visit of 7.7%.  He is tolerating the Ozempic well and was approved for his Dexcom which has been a huge help.  He denies any hypoglycemia.  Analysis of his CGM shows TIR 99%, TAR 1%, TBR 0%.) An ACE inhibitor/angiotensin II receptor blocker is not being taken. He does not see a podiatrist.Eye  exam is current.  Hyperlipidemia This is a chronic problem. The current episode started more than 1 year ago. The problem is uncontrolled. Recent lipid tests were reviewed and are high. Exacerbating diseases include diabetes and obesity. Factors aggravating his hyperlipidemia include fatty foods. Current antihyperlipidemic treatment includes statins. Compliance problems include adherence to diet, adherence to exercise and psychosocial issues.  Risk factors for coronary artery disease include diabetes mellitus, dyslipidemia, family history, male sex, obesity and a sedentary lifestyle.     Review of systems  Constitutional: + steadily decreasing body weight, current Body mass index is 37.34 kg/m., + fatigue-improved, no subjective hyperthermia, no subjective hypothermia Eyes: no blurry vision, no xerophthalmia, blind in R eye ENT: no sore throat, no nodules palpated in throat, no dysphagia/odynophagia, no hoarseness Cardiovascular: no chest pain, no shortness of breath, no palpitations, no leg swelling Respiratory: no cough, no shortness of breath Gastrointestinal: no nausea/vomiting/diarrhea Musculoskeletal: no muscle/joint aches Skin: no rashes, no hyperemia Neurological: no tremors, no numbness, no tingling, no dizziness Psychiatric: no depression, no anxiety, hx of intellectual disability (autistic)  Objective:     BP 109/72 (BP Location: Left Arm, Patient Position: Sitting, Cuff Size: Large)   Pulse 98   Ht 5' 7.5" (1.715 m)   Wt 242 lb (109.8 kg)   BMI 37.34 kg/m   Wt Readings from Last 3 Encounters:  08/17/22 242 lb (109.8 kg)  05/04/22 249 lb (112.9 kg)  01/14/22 243 lb 6.4 oz (110.4 kg)     BP Readings from Last 3 Encounters:  08/17/22 109/72  05/04/22 115/80  01/14/22 113/73     Physical Exam- Limited  Constitutional:  Body mass index is 37.34 kg/m. , not in acute distress, normal state of mind Eyes:  EOMI, no exophthalmos Neck: Supple Cardiovascular: RRR, no  murmurs, rubs, or gallops, no edema Respiratory: Adequate breathing efforts, no crackles, rales, rhonchi, or wheezing Musculoskeletal: no gross deformities, strength intact in all four extremities, no gross restriction of joint movements Skin:  no rashes, no hyperemia Neurological: no tremor with outstretched hands   Diabetic Foot Exam - Simple   Simple Foot Form Visual Inspection See comments: Yes Sensation Testing Intact to touch and monofilament testing bilaterally: Yes Pulse Check Posterior Tibialis and Dorsalis pulse intact bilaterally: Yes Comments Calluses bilaterally     CMP ( most recent) CMP     Component Value Date/Time   NA 141 12/14/2021 0000   K 4.2 12/14/2021 0000   CL 104 12/14/2021 0000   CO2 26 (A) 12/14/2021 0000   GLUCOSE 133 (H) 11/16/2020 0312   BUN 20 12/14/2021 0000   CREATININE  0.6 12/14/2021 0000   CREATININE 0.71 11/16/2020 0312   CALCIUM 9.3 12/14/2021 0000   PROT 7.5 11/16/2020 0312   ALBUMIN 4.1 11/16/2020 0312   AST 42 (H) 11/16/2020 0312   ALT 111 (H) 11/16/2020 0312   ALKPHOS 88 11/16/2020 0312   BILITOT 0.9 11/16/2020 0312   GFRNONAA 126 09/07/2021 0000   GFRNONAA >60 11/16/2020 0312     Diabetic Labs (most recent): Lab Results  Component Value Date   HGBA1C 6.1 (A) 08/17/2022   HGBA1C 7.7 03/26/2022   HGBA1C 10 12/14/2021     Lipid Panel ( most recent) Lipid Panel     Component Value Date/Time   TRIG 224 (A) 09/07/2021 0000   LDLCALC 100 09/07/2021 0000      No results found for: "TSH", "FREET4"         Assessment & Plan:   1) Type 2 diabetes mellitus with hyperglycemia, without long-term current use of insulin ([place])   He presents today, accompanied by his mom, with his CGM showing at goal glycemic profile.  His POCT A1c today is 6.1%, improving from last visit of 7.7%.  He is tolerating the Ozempic well and was approved for his Dexcom which has been a huge help.  He denies any hypoglycemia.  Analysis of his CGM  shows TIR 99%, TAR 1%, TBR 0%.  - Cathren Laine has currently uncontrolled symptomatic type 2 DM since 26 years of age.   -Recent labs reviewed.  - I had a long discussion with him about the progressive nature of diabetes and the pathology behind its complications. -his diabetes is not currently complicated but he remains at a high risk for more acute and chronic complications which include CAD, CVA, CKD, retinopathy, and neuropathy. These are all discussed in detail with him.  - Nutritional counseling repeated at each appointment due to patients tendency to fall back in to old habits.  - The patient admits there is a room for improvement in their diet and drink choices. -  Suggestion is made for the patient to avoid simple carbohydrates from their diet including Cakes, Sweet Desserts / Pastries, Ice Cream, Soda (diet and regular), Sweet Tea, Candies, Chips, Cookies, Sweet Pastries, Store Bought Juices, Alcohol in Excess of 1-2 drinks a day, Artificial Sweeteners, Coffee Creamer, and "Sugar-free" Products. This will help patient to have stable blood glucose profile and potentially avoid unintended weight gain.   - I encouraged the patient to switch to unprocessed or minimally processed complex starch and increased protein intake (animal or plant source), fruits, and vegetables.   - Patient is advised to stick to a routine mealtimes to eat 3 meals a day and avoid unnecessary snacks (to snack only to correct hypoglycemia).  - he will be scheduled with Jearld Fenton, RDN, CDE for diabetes education. He has missed several appointments with her due to his moms migraines.  - I have approached him with the following individualized plan to manage his diabetes and patient agrees:   -He is advised to continue Metformin 500 mg ER twice daily with meals.  I increased his Ozempic to 1 mg SQ weekly (since he is tolerating it well and does not mind injections) and will stop his Glipizide today, given  his account of hypoglycemia.   -he is encouraged to continue monitoring glucose at least twice daily, before breakfast and before bed, and to call the clinic if he has readings less than 70 or above 300 for 3 tests in a row.  He is an excellent candidate for CGM device such as Dexcom given his hypoglycemic unawareness and intellectual disability so that his mother can have access to his glucose readings when they are away from each other.  He has benefited from his CGM, advised to continue wearing it.  - Adjustment parameters are given to him for hypo and hyperglycemia in writing.  - his [place] Pepper was discontinued, risk outweighs benefit for this patient.  - Specific targets for  A1c; LDL, HDL, and Triglycerides were discussed with the patient.  2) Blood Pressure /Hypertension:  his blood pressure is controlled to target without the use of antihypertensive medications.     3) Lipids/Hyperlipidemia:    Review of his recent lipid panel from 09/07/21 showed uncontrolled LDL at 100 and elevated triglycerides of 224 .  he is advised to continue Atorvastatin 20 mg daily at bedtime.  Side effects and precautions discussed with him.  Will recheck lipid panel prior to next visit.  4)  Weight/Diet:  his Body mass index is 37.34 kg/m.  -  clearly complicating his diabetes care.   he is a candidate for weight loss. I discussed with him the fact that loss of 5 - 10% of his  current body weight will have the most impact on his diabetes management.  Exercise, and detailed carbohydrates information provided  -  detailed on discharge instructions.  5) Chronic Care/Health Maintenance: -he is not on ACEI/ARB and was recently started on Statin medications and is encouraged to initiate and continue to follow up with Ophthalmology, Dentist, Podiatrist at least yearly or according to recommendations, and advised to stay away from smoking. I have recommended yearly flu vaccine and pneumonia vaccine at least every 5  years; moderate intensity exercise for up to 150 minutes weekly; and sleep for at least 7 hours a day.  - he is advised to maintain close follow up with Gregory Morgan, MD for primary care needs, as well as his other providers for optimal and coordinated care.     I spent 43 minutes in the care of the patient today including review of labs from CMP, Lipids, Thyroid Function, Hematology (current and previous including abstractions from other facilities); face-to-face time discussing  his blood glucose readings/logs, discussing hypoglycemia and hyperglycemia episodes and symptoms, medications doses, his options of short and long term treatment based on the latest standards of care / guidelines;  discussion about incorporating lifestyle medicine;  and documenting the encounter. Risk reduction counseling performed per USPSTF guidelines to reduce  obesity and cardiovascular risk factors.     Please refer to Patient Instructions for Blood Glucose Monitoring and Insulin/Medications Dosing Guide"  in media tab for additional information. Please  also refer to " Patient Self Inventory" in the Media  tab for reviewed elements of pertinent patient history.  Micheal Likens participated in the discussions, expressed understanding, and voiced agreement with the above plans.  All questions were answered to his satisfaction. he is encouraged to contact clinic should he have any questions or concerns prior to his return visit.     Follow up plan: - Return in about 4 months (around 12/16/2022) for Diabetes F/U with A1c in office, Previsit labs, Bring meter and logs.  Ronny Bacon, [facility] [address] [place], [place] [address] Phone: [phone] Fax: [phone]  08/17/2022, 4:15 PM

## 2022-11-07 ENCOUNTER — Other Ambulatory Visit: Payer: Self-pay | Admitting: Nurse Practitioner

## 2022-11-09 NOTE — Telephone Encounter (Signed)
I will not refill this, it needs to come from his primary provider or whoever originally prescribed it, because I didn't

## 2022-11-09 NOTE — Telephone Encounter (Signed)
I didn't think you had but just wanted to make sure.

## 2022-12-15 ENCOUNTER — Other Ambulatory Visit: Payer: Self-pay

## 2022-12-15 ENCOUNTER — Emergency Department (HOSPITAL_COMMUNITY)
Admission: EM | Admit: 2022-12-15 | Discharge: 2022-12-15 | Disposition: A | Discharge status: Home / Self Care | Payer: 59 | Attending: Emergency Medicine | Admitting: Emergency Medicine

## 2022-12-15 ENCOUNTER — Encounter (HOSPITAL_COMMUNITY): Payer: Self-pay

## 2022-12-15 DIAGNOSIS — Z7984 Long term (current) use of oral hypoglycemic drugs: Secondary | ICD-10-CM | POA: Insufficient documentation

## 2022-12-15 DIAGNOSIS — E119 Type 2 diabetes mellitus without complications: Secondary | ICD-10-CM | POA: Diagnosis not present

## 2022-12-15 DIAGNOSIS — U071 COVID-19: Secondary | ICD-10-CM | POA: Insufficient documentation

## 2022-12-15 DIAGNOSIS — F840 Autistic disorder: Secondary | ICD-10-CM | POA: Diagnosis not present

## 2022-12-15 DIAGNOSIS — R509 Fever, unspecified: Secondary | ICD-10-CM | POA: Diagnosis present

## 2022-12-15 NOTE — ED Provider Notes (Signed)
[facility] Provider Note   CSN: [id number] Arrival date & time: 12/15/22  0040     History  Chief Complaint  Patient presents with   Covid Positive    Gregory Kelley is a 27 y.o. male.  Patient is a 27 year old male with past medical history of autism, type 2 diabetes.  Patient presenting today with complaints of fever, cough, and bodyaches.  This has been ongoing for the past 2 days.  He did a home COVID test which was positive.  He called his primary doctor who prescribed Paxlovid and he began taking this today.  He denies any chest pain or difficulty breathing.  The history is provided by the patient.       Home Medications Prior to Admission medications   Medication Sig Start Date End Date Taking? Authorizing Provider  amphetamine-dextroamphetamine (ADDERALL) 30 MG tablet Take 1 tablet by mouth 2 (two) times daily. 10/14/20   [provider]  atorvastatin (LIPITOR) 20 MG tablet Take 20 mg by mouth daily. 09/15/21   [provider]  Clobetasol Propionate 0.05 % shampoo SMARTSIG:Sparingly Topical Daily 08/25/21   [provider]  Continuous Blood Gluc Sensor (DEXCOM G6 SENSOR) MISC Change sensor every 10 days as directed 05/04/22   Brita Romp, NP  Continuous Blood Gluc Transmit (DEXCOM G6 TRANSMITTER) MISC Change transmitter every 90 days as directed. 05/04/22   Brita Romp, NP  ibuprofen (ADVIL) 800 MG tablet Take 800 mg by mouth every 6 (six) hours as needed for moderate pain or headache. 12/12/20   [provider]  metFORMIN (GLUCOPHAGE-XR) 500 MG 24 hr tablet Take 2 tablets (1,000 mg total) by mouth daily with breakfast. 08/17/22   Brita Romp, NP  Multiple Vitamin (MULTI-VITAMIN) tablet Take 1 tablet by mouth daily.    [provider]  Semaglutide, 1 MG/DOSE, (OZEMPIC, 1 MG/DOSE,) 4 MG/3ML SOPN Inject 1 mg into the skin once a week. 08/17/22   Brita Romp, NP   VYVANSE 70 MG capsule Take 70 mg by mouth every morning. 01/29/21   [provider]      Allergies    Patient has no known allergies.    Review of Systems   Review of Systems  All other systems reviewed and are negative.   Physical Exam Updated Vital Signs BP 126/85   Pulse (!) 119   Temp (!) 97.4 F (36.3 C)   Resp 19   Wt 107 kg   SpO2 96%   BMI 36.42 kg/m  Physical Exam Vitals and nursing note reviewed.  Constitutional:      General: He is not in acute distress.    Appearance: He is well-developed. He is not diaphoretic.  HENT:     Head: Normocephalic and atraumatic.     Mouth/Throat:     Mouth: Mucous membranes are moist.     Pharynx: No oropharyngeal exudate or posterior oropharyngeal erythema.  Cardiovascular:     Rate and Rhythm: Normal rate and regular rhythm.     Heart sounds: No murmur heard.    No friction rub.  Pulmonary:     Effort: Pulmonary effort is normal. No respiratory distress.     Breath sounds: Normal breath sounds. No wheezing or rales.  Abdominal:     General: Bowel sounds are normal. There is no distension.     Palpations: Abdomen is soft.     Tenderness: There is no abdominal tenderness.  Musculoskeletal:  General: Normal range of motion.     Cervical back: Normal range of motion and neck supple.  Skin:    General: Skin is warm and dry.  Neurological:     Mental Status: He is alert and oriented to person, place, and time.     Coordination: Coordination normal.     ED Results / Procedures / Treatments   Labs (all labs ordered are listed, but only abnormal results are displayed) Labs Reviewed - No data to display  EKG None  Radiology No results found.  Procedures Procedures    Medications Ordered in ED Medications - No data to display  ED Course/ Medical Decision Making/ A&P  Patient presenting with URI symptoms and positive home COVID test.  He was already started on Paxlovid today by his primary  doctor.  Patient arrives here with stable vital signs, no hypoxia, and appears clinically well.  I see no indication for additional testing.  I will advise patient to take over-the-counter medications as needed for symptom relief and continue taking Paxlovid.  Final Clinical Impression(s) / ED Diagnoses Final diagnoses:  None    Rx / DC Orders ED Discharge Orders     None         Veryl Speak, MD 12/15/22 [phone]

## 2022-12-15 NOTE — ED Triage Notes (Signed)
Pt tested positive for covid on home test yesterday. Pt now complaining of cough, SOB, and metal taste in mouth.

## 2022-12-15 NOTE — Discharge Instructions (Signed)
Continue taking Paxlovid as prescribed.  Take over-the-counter medications as needed for relief of symptoms.

## 2022-12-16 ENCOUNTER — Ambulatory Visit: Payer: Medicare Other | Admitting: Nurse Practitioner

## 2022-12-16 DIAGNOSIS — E782 Mixed hyperlipidemia: Secondary | ICD-10-CM

## 2022-12-16 DIAGNOSIS — E1165 Type 2 diabetes mellitus with hyperglycemia: Secondary | ICD-10-CM

## 2022-12-31 LAB — COMPREHENSIVE METABOLIC PANEL
ALT: 40 IU/L (ref 0–44)
AST: 20 IU/L (ref 0–40)
Albumin/Globulin Ratio: 1.7 (ref 1.2–2.2)
Albumin: 4.4 g/dL (ref 4.3–5.2)
Alkaline Phosphatase: 111 IU/L (ref 44–121)
BUN/Creatinine Ratio: 13 (ref 9–20)
BUN: 11 mg/dL (ref 6–20)
Bilirubin Total: 1 mg/dL (ref 0.0–1.2)
CO2: 25 mmol/L (ref 20–29)
Calcium: 9.2 mg/dL (ref 8.7–10.2)
Chloride: 102 mmol/L (ref 96–106)
Creatinine, Ser: 0.82 mg/dL (ref 0.76–1.27)
Globulin, Total: 2.6 g/dL (ref 1.5–4.5)
Glucose: 101 mg/dL — ABNORMAL HIGH (ref 70–99)
Potassium: 4 mmol/L (ref 3.5–5.2)
Sodium: 142 mmol/L (ref 134–144)
Total Protein: 7 g/dL (ref 6.0–8.5)
eGFR: 123 mL/min/{1.73_m2} (ref >59)

## 2022-12-31 LAB — LIPID PANEL
Chol/HDL Ratio: 3.3 ratio (ref 0.0–5.0)
Cholesterol, Total: 133 mg/dL (ref 100–199)
HDL: 40 mg/dL (ref >39)
LDL Chol Calc (NIH): 78 mg/dL (ref 0–99)
Triglycerides: 73 mg/dL (ref 0–149)
VLDL Cholesterol Cal: 15 mg/dL (ref 5–40)

## 2022-12-31 LAB — T4, FREE: Free T4: 1.29 ng/dL (ref 0.82–1.77)

## 2022-12-31 LAB — VITAMIN D 25 HYDROXY (VIT D DEFICIENCY, FRACTURES): Vit D, 25-Hydroxy: 18.2 ng/mL — ABNORMAL LOW (ref 30.0–100.0)

## 2022-12-31 LAB — TSH: TSH: 0.832 u[IU]/mL (ref 0.450–4.500)

## 2023-01-03 ENCOUNTER — Ambulatory Visit: Payer: 59 | Admitting: Nurse Practitioner

## 2023-01-03 DIAGNOSIS — E782 Mixed hyperlipidemia: Secondary | ICD-10-CM

## 2023-01-03 DIAGNOSIS — E1165 Type 2 diabetes mellitus with hyperglycemia: Secondary | ICD-10-CM

## 2023-02-02 ENCOUNTER — Ambulatory Visit (INDEPENDENT_AMBULATORY_CARE_PROVIDER_SITE_OTHER): Payer: 59 | Admitting: Nurse Practitioner

## 2023-02-02 ENCOUNTER — Encounter: Payer: Self-pay | Admitting: Nurse Practitioner

## 2023-02-02 VITALS — BP 116/74 | HR 78 | Ht 67.5 in | Wt 246.2 lb

## 2023-02-02 DIAGNOSIS — Z7985 Long-term (current) use of injectable non-insulin antidiabetic drugs: Secondary | ICD-10-CM

## 2023-02-02 DIAGNOSIS — Z7984 Long term (current) use of oral hypoglycemic drugs: Secondary | ICD-10-CM | POA: Diagnosis not present

## 2023-02-02 DIAGNOSIS — E1165 Type 2 diabetes mellitus with hyperglycemia: Secondary | ICD-10-CM | POA: Diagnosis not present

## 2023-02-02 LAB — POCT GLYCOSYLATED HEMOGLOBIN (HGB A1C): Hemoglobin A1C: 5.8 % — AB (ref 4.0–5.6)

## 2023-02-02 MED ORDER — OZEMPIC (1 MG/DOSE) 4 MG/3ML ~~LOC~~ SOPN: 1.0000 mg | PEN_INJECTOR | SUBCUTANEOUS | 3 refills | Status: DC

## 2023-02-02 MED ORDER — METFORMIN HCL ER 500 MG PO TB24: 1000.0000 mg | ORAL_TABLET | Freq: Every day | ORAL | 3 refills | Status: AC

## 2023-02-02 MED ORDER — DEXCOM G7 SENSOR MISC: 1.0000 | 3 refills | Status: DC

## 2023-02-02 NOTE — Progress Notes (Signed)
Endocrinology Follow Up Note       02/02/2023, 3:45 PM   Subjective:    Patient ID: Gregory Kelley, male    DOB: Nov 01, 1995.  Gregory Kelley is being seen in follow up after being seen in consultation for management of currently uncontrolled symptomatic diabetes requested by  Gareth Morgan, MD.  He is accompanied today by his adopted mother due to his intellectual disability.   Past Medical History:  Diagnosis Date   Autism    Diabetes mellitus    Fetal alcohol syndrome     Past Surgical History:  Procedure Laterality Date   HERNIA REPAIR      Social History   Socioeconomic History   Marital status: Single    Spouse name: Not on file   Number of children: Not on file   Years of education: Not on file   Highest education level: Not on file  Occupational History   Not on file  Tobacco Use   Smoking status: Never   Smokeless tobacco: Never  Vaping Use   Vaping Use: Some days  Substance and Sexual Activity   Alcohol use: Yes    Comment: occasionally    Drug use: No   Sexual activity: Not on file  Other Topics Concern   Not on file  Social History Narrative   Not on file   Social Determinants of Health   Financial Resource Strain: Not on file  Food Insecurity: Not on file  Transportation Needs: Not on file  Physical Activity: Not on file  Stress: Not on file  Social Connections: Not on file    Family History  Adopted: Yes    Current Outpatient Medications on File Prior to Visit  Medication Sig Dispense Refill   amphetamine-dextroamphetamine (ADDERALL) 30 MG tablet Take 1 tablet by mouth 2 (two) times daily.     atorvastatin (LIPITOR) 20 MG tablet Take 20 mg by mouth daily.     Clobetasol Propionate 0.05 % shampoo SMARTSIG:Sparingly Topical Daily     Continuous Blood Gluc Transmit (DEXCOM G6 TRANSMITTER) MISC Change transmitter every 90 days as directed. 1 each 3    ibuprofen (ADVIL) 800 MG tablet Take 800 mg by mouth every 6 (six) hours as needed for moderate pain or headache.     Multiple Vitamin (MULTI-VITAMIN) tablet Take 1 tablet by mouth daily.     VYVANSE 70 MG capsule Take 70 mg by mouth every morning.     No current facility-administered medications on file prior to visit.      ALLERGIES: No Known Allergies  VACCINATION STATUS:  There is no immunization history on file for this patient.  Diabetes He presents for his follow-up diabetic visit. He has type 2 diabetes mellitus. Onset time: newly diagnosed at age of 37. His disease course has been improving. There are no hypoglycemic associated symptoms. 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 Pertinent negatives for diabetes include no weight loss. There are no hypoglycemic complications. Symptoms are stable. There are no diabetic complications. Risk factors for coronary artery disease include diabetes mellitus, dyslipidemia, family history, male sex, obesity and sedentary lifestyle. Current diabetic treatment includes oral agent (  monotherapy) (and Ozempic). He is compliant with treatment most of the time. His weight is decreasing steadily. He is following a generally unhealthy diet. When asked about meal planning, he reported none. He has not had a previous visit with a dietitian. He rarely participates in exercise. His home blood glucose trend is decreasing steadily. His overall blood glucose range is 130-140 mg/dl. (He presents today, accompanied by his mom, with his CGM showing at goal glycemic profile.  His POCT A1c today is 5.8%, improving from last visit of 6.1%.  He denies any hypoglycemia.  They are asking about upgrading to the [facility] G7 since it is all one piece for ease of use.) An ACE inhibitor/angiotensin II receptor blocker is not being taken. He does not see a podiatrist.Eye exam is current.  Hyperlipidemia This is a chronic  problem. The current episode started more than 1 year ago. The problem is uncontrolled. Recent lipid tests were reviewed and are high. Exacerbating diseases include diabetes and obesity. Factors aggravating his hyperlipidemia include fatty foods. Current antihyperlipidemic treatment includes statins. Compliance problems include adherence to diet, adherence to exercise and psychosocial issues.  Risk factors for coronary artery disease include diabetes mellitus, dyslipidemia, family history, male sex, obesity and a sedentary lifestyle.     Review of systems  Constitutional: + stable body weight, current Body mass index is 37.99 kg/m., + fatigue-improved, no subjective hyperthermia, no subjective hypothermia Eyes: no blurry vision, no xerophthalmia, blind in R eye ENT: no sore throat, no nodules palpated in throat, no dysphagia/odynophagia, no hoarseness Cardiovascular: no chest pain, no shortness of breath, no palpitations, no leg swelling Respiratory: no cough, no shortness of breath Gastrointestinal: no nausea/vomiting/diarrhea Musculoskeletal: no muscle/joint aches Skin: no rashes, no hyperemia Neurological: no tremors, no numbness, no tingling, no dizziness Psychiatric: no depression, no anxiety, hx of intellectual disability (autistic)  Objective:     BP 116/74 (BP Location: Right Arm, Patient Position: Sitting, Cuff Size: Large)   Pulse 78   Ht 5' 7.5" (1.715 m)   Wt 246 lb 3.2 oz (111.7 kg)   BMI 37.99 kg/m   Wt Readings from Last 3 Encounters:  02/02/23 246 lb 3.2 oz (111.7 kg)  12/15/22 236 lb (107 kg)  08/17/22 242 lb (109.8 kg)     BP Readings from Last 3 Encounters:  02/02/23 116/74  12/15/22 116/82  08/17/22 109/72     Physical Exam- Limited  Constitutional:  Body mass index is 37.99 kg/m. , not in acute distress, normal state of mind Eyes:  EOMI, no exophthalmos Musculoskeletal: no gross deformities, strength intact in all four extremities, no gross restriction  of joint movements Skin:  no rashes, no hyperemia Neurological: no tremor with outstretched hands   Diabetic Foot Exam - Simple   Simple Foot Form Diabetic Foot exam was performed with the following findings: Yes 02/02/2023  3:43 PM  Visual Inspection No deformities, no ulcerations, no other skin breakdown bilaterally: Yes Sensation Testing Intact to touch and monofilament testing bilaterally: Yes Pulse Check Posterior Tibialis and Dorsalis pulse intact bilaterally: Yes Comments     CMP ( most recent) CMP     Component Value Date/Time   NA 142 12/30/2022 1349   K 4.0 12/30/2022 1349   CL 102 12/30/2022 1349   CO2 25 12/30/2022 1349   GLUCOSE 101 (H) 12/30/2022 1349   GLUCOSE 133 (H) 11/16/2020 0312   BUN 11 12/30/2022 1349   CREATININE 0.82 12/30/2022 1349   CALCIUM 9.2 12/30/2022 1349   PROT  7.0 12/30/2022 1349   ALBUMIN 4.4 12/30/2022 1349   AST 20 12/30/2022 1349   ALT 40 12/30/2022 1349   ALKPHOS 111 12/30/2022 1349   BILITOT 1.0 12/30/2022 1349   GFRNONAA 126 09/07/2021 0000   GFRNONAA >60 11/16/2020 0312     Diabetic Labs (most recent): Lab Results  Component Value Date   HGBA1C 5.8 (A) 02/02/2023   HGBA1C 6.1 (A) 08/17/2022   HGBA1C 7.7 03/26/2022     Lipid Panel ( most recent) Lipid Panel     Component Value Date/Time   CHOL 133 12/30/2022 1349   TRIG 73 12/30/2022 1349   HDL 40 12/30/2022 1349   CHOLHDL 3.3 12/30/2022 1349   LDLCALC 78 12/30/2022 1349   LABVLDL 15 12/30/2022 1349      Lab Results  Component Value Date   TSH 0.832 12/30/2022   FREET4 1.29 12/30/2022           Assessment & Plan:   1) Type 2 diabetes mellitus with hyperglycemia, without long-term current use of insulin (HCC)  He presents today, accompanied by his mom, with his CGM showing at goal glycemic profile.  His POCT A1c today is 5.8%, improving from last visit of 6.1%.  He denies any hypoglycemia.  They are asking about upgrading to the [facility] G7 since it is  all one piece for ease of use.  - Micheal Likens has currently uncontrolled symptomatic type 2 DM since 27 years of age.   -Recent labs reviewed.  - I had a long discussion with him about the progressive nature of diabetes and the pathology behind its complications. -his diabetes is not currently complicated but he remains at a high risk for more acute and chronic complications which include CAD, CVA, CKD, retinopathy, and neuropathy. These are all discussed in detail with him.  - Nutritional counseling repeated at each appointment due to patients tendency to fall back in to old habits.  - The patient admits there is a room for improvement in their diet and drink choices. -  Suggestion is made for the patient to avoid simple carbohydrates from their diet including Cakes, Sweet Desserts / Pastries, Ice Cream, Soda (diet and regular), Sweet Tea, Candies, Chips, Cookies, Sweet Pastries, Store Bought Juices, Alcohol in Excess of 1-2 drinks a day, Artificial Sweeteners, Coffee Creamer, and "Sugar-free" Products. This will help patient to have stable blood glucose profile and potentially avoid unintended weight gain.   - I encouraged the patient to switch to unprocessed or minimally processed complex starch and increased protein intake (animal or plant source), fruits, and vegetables.   - Patient is advised to stick to a routine mealtimes to eat 3 meals a day and avoid unnecessary snacks (to snack only to correct hypoglycemia).  - he will be scheduled with Norm Salt, RDN, CDE for diabetes education. He has missed several appointments with her due to his moms migraines.  - I have approached him with the following individualized plan to manage his diabetes and patient agrees:   -Given his stable glycemic profile, no changes will be made to his medications today.  He is advised to continue Ozempic 1 mg SQ weekly and Metformin 500 mg ER twice daily with meals.  -he is encouraged to continue  monitoring glucose at least twice daily, before breakfast and before bed, and to call the clinic if he has readings less than 70 or above 300 for 3 tests in a row.   He is an excellent candidate for CGM  device such as Dexcom given his hypoglycemic unawareness and intellectual disability so that his mother can have access to his glucose readings when they are away from each other.  He has benefited from his CGM, advised to continue wearing it.  Will send in for Dexcom G7 to his pharmacy.  - Adjustment parameters are given to him for hypo and hyperglycemia in writing.  - his [place] Pepper was previously discontinued, risk outweighs benefit for this patient.  - Specific targets for  A1c; LDL, HDL, and Triglycerides were discussed with the patient.  2) Blood Pressure /Hypertension:  his blood pressure is controlled to target without the use of antihypertensive medications.     3) Lipids/Hyperlipidemia:    Review of his recent lipid panel from 12/30/22 showed controlled LDL at 78.  he is advised to continue Atorvastatin 20 mg daily at bedtime.  Side effects and precautions discussed with him.    4)  Weight/Diet:  his Body mass index is 37.99 kg/m.  -  clearly complicating his diabetes care.   he is a candidate for weight loss. I discussed with him the fact that loss of 5 - 10% of his  current body weight will have the most impact on his diabetes management.  Exercise, and detailed carbohydrates information provided  -  detailed on discharge instructions.  5) Chronic Care/Health Maintenance: -he is not on ACEI/ARB and was recently started on Statin medications and is encouraged to initiate and continue to follow up with Ophthalmology, Dentist, Podiatrist at least yearly or according to recommendations, and advised to stay away from smoking. I have recommended yearly flu vaccine and pneumonia vaccine at least every 5 years; moderate intensity exercise for up to 150 minutes weekly; and sleep for at least 7  hours a day.  - he is advised to maintain close follow up with Gareth Morgan, MD for primary care needs, as well as his other providers for optimal and coordinated care.     I spent  25  minutes in the care of the patient today including review of labs from CMP, Lipids, Thyroid Function, Hematology (current and previous including abstractions from other facilities); face-to-face time discussing  his blood glucose readings/logs, discussing hypoglycemia and hyperglycemia episodes and symptoms, medications doses, his options of short and long term treatment based on the latest standards of care / guidelines;  discussion about incorporating lifestyle medicine;  and documenting the encounter. Risk reduction counseling performed per USPSTF guidelines to reduce obesity and cardiovascular risk factors.     Please refer to Patient Instructions for Blood Glucose Monitoring and Insulin/Medications Dosing Guide"  in media tab for additional information. Please  also refer to " Patient Self Inventory" in the Media  tab for reviewed elements of pertinent patient history.  Micheal Likens participated in the discussions, expressed understanding, and voiced agreement with the above plans.  All questions were answered to his satisfaction. he is encouraged to contact clinic should he have any questions or concerns prior to his return visit.     Follow up plan: - Return in about 4 months (around 06/04/2023) for Diabetes F/U with A1c in office, No previsit labs, Bring meter and logs.  Ronny Bacon, [facility] [address] [place], [place] [address] Phone: [phone] Fax: [phone]  02/02/2023, 3:45 PM

## 2023-02-18 ENCOUNTER — Other Ambulatory Visit: Payer: Self-pay | Admitting: Nurse Practitioner

## 2023-03-31 ENCOUNTER — Other Ambulatory Visit: Payer: Self-pay | Admitting: Nurse Practitioner

## 2023-03-31 DIAGNOSIS — E1165 Type 2 diabetes mellitus with hyperglycemia: Secondary | ICD-10-CM

## 2023-06-07 ENCOUNTER — Ambulatory Visit: Payer: 59 | Admitting: Nurse Practitioner

## 2023-08-24 ENCOUNTER — Ambulatory Visit: Payer: 59 | Admitting: Nurse Practitioner

## 2023-08-24 DIAGNOSIS — Z7984 Long term (current) use of oral hypoglycemic drugs: Secondary | ICD-10-CM

## 2023-08-24 DIAGNOSIS — Z7985 Long-term (current) use of injectable non-insulin antidiabetic drugs: Secondary | ICD-10-CM

## 2023-08-24 DIAGNOSIS — E1165 Type 2 diabetes mellitus with hyperglycemia: Secondary | ICD-10-CM

## 2023-08-24 DIAGNOSIS — E782 Mixed hyperlipidemia: Secondary | ICD-10-CM

## 2023-09-25 ENCOUNTER — Other Ambulatory Visit: Payer: Self-pay | Admitting: Nurse Practitioner

## 2023-09-25 DIAGNOSIS — E1165 Type 2 diabetes mellitus with hyperglycemia: Secondary | ICD-10-CM

## 2023-09-27 ENCOUNTER — Other Ambulatory Visit: Payer: Self-pay | Admitting: Nurse Practitioner

## 2023-09-27 DIAGNOSIS — E1165 Type 2 diabetes mellitus with hyperglycemia: Secondary | ICD-10-CM

## 2023-09-28 ENCOUNTER — Telehealth: Payer: Self-pay | Admitting: Nurse Practitioner

## 2023-09-28 ENCOUNTER — Other Ambulatory Visit (HOSPITAL_COMMUNITY): Payer: Self-pay

## 2023-09-28 NOTE — Telephone Encounter (Signed)
Test claim for Ozempic was successful with a $0 copay. However, there is a note attached to the prescription that states:   It looks like he needs to be seen before he can get another fill.   Test claim for Dexcom shows refill too soon. It was last picked up 09/16/23. No PA required.

## 2023-09-28 NOTE — Telephone Encounter (Signed)
Have either of the two of y'all seen anything on this? Ozempic denied,and Dexcom G7 sensors.

## 2023-09-28 NOTE — Telephone Encounter (Signed)
Pt's mother called and said that his ozempic was denied. She said that he has an appt in Feb. She is asking also about the dexcom 7. She said its not getting approved. Please Advise.

## 2023-09-29 ENCOUNTER — Other Ambulatory Visit: Payer: Self-pay | Admitting: *Deleted

## 2023-09-29 DIAGNOSIS — E1165 Type 2 diabetes mellitus with hyperglycemia: Secondary | ICD-10-CM

## 2023-09-29 MED ORDER — DEXCOM G6 SENSOR MISC: 0 refills | Status: AC

## 2023-09-29 NOTE — Telephone Encounter (Signed)
Yes, this patient has missed several appts with me recently.  Last seen in April.  We can refill just long enough to get him to his next appt but he CANNOT cancel, as I will not send in more refills.

## 2023-09-29 NOTE — Telephone Encounter (Signed)
A prescription was sent in for the Dexcom sensors

## 2023-11-24 ENCOUNTER — Encounter: Payer: Self-pay | Admitting: Nurse Practitioner

## 2023-11-24 ENCOUNTER — Telehealth: Payer: Self-pay | Admitting: Nurse Practitioner

## 2023-11-24 ENCOUNTER — Ambulatory Visit: Payer: 59 | Admitting: Nurse Practitioner

## 2023-11-24 DIAGNOSIS — E782 Mixed hyperlipidemia: Secondary | ICD-10-CM

## 2023-11-24 DIAGNOSIS — E1165 Type 2 diabetes mellitus with hyperglycemia: Secondary | ICD-10-CM

## 2023-11-24 NOTE — Telephone Encounter (Signed)
Noted. Sent it through [employer] and mailed a copy
